# Patient Record
Sex: Female | Born: 1950 | Race: White | Hispanic: No | Marital: Married | Smoking: Never smoker
Health system: Southern US, Community
[De-identification: ages and names within clinical notes are randomized; demographics above are authoritative.]

## PROBLEM LIST (undated history)

## (undated) DIAGNOSIS — E7800 Pure hypercholesterolemia, unspecified: Secondary | ICD-10-CM

## (undated) DIAGNOSIS — B009 Herpesviral infection, unspecified: Secondary | ICD-10-CM

## (undated) DIAGNOSIS — M8580 Other specified disorders of bone density and structure, unspecified site: Secondary | ICD-10-CM

## (undated) DIAGNOSIS — R87619 Unspecified abnormal cytological findings in specimens from cervix uteri: Secondary | ICD-10-CM

## (undated) HISTORY — PX: CERVICAL BIOPSY  W/ LOOP ELECTRODE EXCISION: SUR135

## (undated) HISTORY — DX: Other specified disorders of bone density and structure, unspecified site: M85.80

## (undated) HISTORY — DX: Herpesviral infection, unspecified: B00.9

## (undated) HISTORY — DX: Pure hypercholesterolemia, unspecified: E78.00

## (undated) HISTORY — DX: Unspecified abnormal cytological findings in specimens from cervix uteri: R87.619

---

## 1997-05-30 HISTORY — PX: ABDOMINAL HYSTERECTOMY: SHX81

## 1997-10-28 ENCOUNTER — Ambulatory Visit (HOSPITAL_COMMUNITY): Admission: RE | Admit: 1997-10-28 | Discharge: 1997-10-28 | Payer: Self-pay | Admitting: Gastroenterology

## 1997-10-30 ENCOUNTER — Other Ambulatory Visit: Admission: RE | Admit: 1997-10-30 | Discharge: 1997-10-30 | Payer: Self-pay | Admitting: Gynecology

## 1997-12-15 ENCOUNTER — Inpatient Hospital Stay (HOSPITAL_COMMUNITY): Admission: RE | Admit: 1997-12-15 | Discharge: 1997-12-17 | Payer: Self-pay | Admitting: Gynecology

## 1999-11-15 ENCOUNTER — Encounter: Admission: RE | Admit: 1999-11-15 | Discharge: 1999-11-15 | Payer: Self-pay | Admitting: Gynecology

## 1999-11-15 ENCOUNTER — Encounter: Payer: Self-pay | Admitting: Gynecology

## 2000-11-28 ENCOUNTER — Encounter: Payer: Self-pay | Admitting: Gynecology

## 2000-11-28 ENCOUNTER — Encounter: Admission: RE | Admit: 2000-11-28 | Discharge: 2000-11-28 | Payer: Self-pay | Admitting: Gynecology

## 2000-12-19 ENCOUNTER — Other Ambulatory Visit: Admission: RE | Admit: 2000-12-19 | Discharge: 2000-12-19 | Payer: Self-pay | Admitting: Gynecology

## 2001-04-23 ENCOUNTER — Other Ambulatory Visit: Admission: RE | Admit: 2001-04-23 | Discharge: 2001-04-23 | Payer: Self-pay | Admitting: Gynecology

## 2001-07-24 ENCOUNTER — Other Ambulatory Visit: Admission: RE | Admit: 2001-07-24 | Discharge: 2001-07-24 | Payer: Self-pay | Admitting: Gynecology

## 2001-12-05 ENCOUNTER — Encounter: Payer: Self-pay | Admitting: Gynecology

## 2001-12-05 ENCOUNTER — Encounter: Admission: RE | Admit: 2001-12-05 | Discharge: 2001-12-05 | Payer: Self-pay | Admitting: Gynecology

## 2002-02-05 ENCOUNTER — Other Ambulatory Visit: Admission: RE | Admit: 2002-02-05 | Discharge: 2002-02-05 | Payer: Self-pay | Admitting: Gynecology

## 2002-09-13 ENCOUNTER — Other Ambulatory Visit: Admission: RE | Admit: 2002-09-13 | Discharge: 2002-09-13 | Payer: Self-pay | Admitting: Gynecology

## 2002-12-11 ENCOUNTER — Encounter: Admission: RE | Admit: 2002-12-11 | Discharge: 2002-12-11 | Payer: Self-pay | Admitting: Gynecology

## 2002-12-11 ENCOUNTER — Encounter: Payer: Self-pay | Admitting: Gynecology

## 2003-10-01 ENCOUNTER — Other Ambulatory Visit: Admission: RE | Admit: 2003-10-01 | Discharge: 2003-10-01 | Payer: Self-pay | Admitting: Gynecology

## 2004-01-12 ENCOUNTER — Encounter: Admission: RE | Admit: 2004-01-12 | Discharge: 2004-01-12 | Payer: Self-pay | Admitting: Gynecology

## 2005-03-03 ENCOUNTER — Other Ambulatory Visit: Admission: RE | Admit: 2005-03-03 | Discharge: 2005-03-03 | Payer: Self-pay | Admitting: Gynecology

## 2005-03-21 ENCOUNTER — Encounter: Admission: RE | Admit: 2005-03-21 | Discharge: 2005-03-21 | Payer: Self-pay | Admitting: Gynecology

## 2006-03-07 ENCOUNTER — Other Ambulatory Visit: Admission: RE | Admit: 2006-03-07 | Discharge: 2006-03-07 | Payer: Self-pay | Admitting: Gynecology

## 2006-03-29 ENCOUNTER — Encounter: Admission: RE | Admit: 2006-03-29 | Discharge: 2006-03-29 | Payer: Self-pay | Admitting: Gynecology

## 2007-04-02 ENCOUNTER — Encounter: Admission: RE | Admit: 2007-04-02 | Discharge: 2007-04-02 | Payer: Self-pay | Admitting: Gynecology

## 2008-05-05 ENCOUNTER — Encounter: Admission: RE | Admit: 2008-05-05 | Discharge: 2008-05-05 | Payer: Self-pay | Admitting: Gynecology

## 2009-01-03 ENCOUNTER — Emergency Department (HOSPITAL_BASED_OUTPATIENT_CLINIC_OR_DEPARTMENT_OTHER): Admission: EM | Admit: 2009-01-03 | Discharge: 2009-01-03 | Payer: Self-pay | Admitting: Emergency Medicine

## 2009-01-09 ENCOUNTER — Emergency Department (HOSPITAL_BASED_OUTPATIENT_CLINIC_OR_DEPARTMENT_OTHER): Admission: EM | Admit: 2009-01-09 | Discharge: 2009-01-09 | Payer: Self-pay | Admitting: Emergency Medicine

## 2009-05-06 ENCOUNTER — Encounter: Admission: RE | Admit: 2009-05-06 | Discharge: 2009-05-06 | Payer: Self-pay | Admitting: Gynecology

## 2010-05-13 ENCOUNTER — Encounter
Admission: RE | Admit: 2010-05-13 | Discharge: 2010-05-13 | Payer: Self-pay | Source: Home / Self Care | Attending: Gynecology | Admitting: Gynecology

## 2011-04-07 ENCOUNTER — Other Ambulatory Visit: Payer: Self-pay | Admitting: Gynecology

## 2011-04-07 DIAGNOSIS — Z1231 Encounter for screening mammogram for malignant neoplasm of breast: Secondary | ICD-10-CM

## 2011-05-16 ENCOUNTER — Ambulatory Visit
Admission: RE | Admit: 2011-05-16 | Discharge: 2011-05-16 | Disposition: A | Discharge status: Home / Self Care | Payer: BC Managed Care – PPO | Source: Ambulatory Visit | Attending: Gynecology | Admitting: Gynecology

## 2011-05-16 DIAGNOSIS — Z1231 Encounter for screening mammogram for malignant neoplasm of breast: Secondary | ICD-10-CM

## 2012-04-17 ENCOUNTER — Other Ambulatory Visit: Payer: Self-pay | Admitting: Gynecology

## 2012-04-17 DIAGNOSIS — Z1231 Encounter for screening mammogram for malignant neoplasm of breast: Secondary | ICD-10-CM

## 2012-06-04 ENCOUNTER — Ambulatory Visit
Admission: RE | Admit: 2012-06-04 | Discharge: 2012-06-04 | Disposition: A | Discharge status: Home / Self Care | Payer: BC Managed Care – PPO | Source: Ambulatory Visit | Attending: Gynecology | Admitting: Gynecology

## 2012-06-04 DIAGNOSIS — Z1231 Encounter for screening mammogram for malignant neoplasm of breast: Secondary | ICD-10-CM

## 2012-12-10 ENCOUNTER — Ambulatory Visit (INDEPENDENT_AMBULATORY_CARE_PROVIDER_SITE_OTHER): Payer: BC Managed Care – PPO | Admitting: Gynecology

## 2012-12-10 ENCOUNTER — Other Ambulatory Visit (HOSPITAL_COMMUNITY)
Admission: RE | Admit: 2012-12-10 | Discharge: 2012-12-10 | Disposition: A | Discharge status: Home / Self Care | Payer: BC Managed Care – PPO | Source: Ambulatory Visit | Attending: Gynecology | Admitting: Gynecology

## 2012-12-10 ENCOUNTER — Encounter: Payer: Self-pay | Admitting: Gynecology

## 2012-12-10 VITALS — BP 128/80 | Ht 64.75 in | Wt 144.0 lb

## 2012-12-10 DIAGNOSIS — M899 Disorder of bone, unspecified: Secondary | ICD-10-CM

## 2012-12-10 DIAGNOSIS — Z79890 Hormone replacement therapy (postmenopausal): Secondary | ICD-10-CM

## 2012-12-10 DIAGNOSIS — N952 Postmenopausal atrophic vaginitis: Secondary | ICD-10-CM

## 2012-12-10 DIAGNOSIS — Z01419 Encounter for gynecological examination (general) (routine) without abnormal findings: Secondary | ICD-10-CM

## 2012-12-10 DIAGNOSIS — R635 Abnormal weight gain: Secondary | ICD-10-CM

## 2012-12-10 DIAGNOSIS — Z1159 Encounter for screening for other viral diseases: Secondary | ICD-10-CM

## 2012-12-10 DIAGNOSIS — Z1151 Encounter for screening for human papillomavirus (HPV): Secondary | ICD-10-CM | POA: Insufficient documentation

## 2012-12-10 DIAGNOSIS — M8580 Other specified disorders of bone density and structure, unspecified site: Secondary | ICD-10-CM

## 2012-12-10 DIAGNOSIS — M949 Disorder of cartilage, unspecified: Secondary | ICD-10-CM

## 2012-12-10 DIAGNOSIS — E7800 Pure hypercholesterolemia, unspecified: Secondary | ICD-10-CM

## 2012-12-10 DIAGNOSIS — E785 Hyperlipidemia, unspecified: Secondary | ICD-10-CM | POA: Insufficient documentation

## 2012-12-10 NOTE — Addendum Note (Signed)
Addended by: Ok Edwards on: 12/10/2012 03:44 PM   Modules accepted: Orders

## 2012-12-10 NOTE — Progress Notes (Signed)
Karla Lee 1951/02/02 [id number]   History:    62 y.o.  for annual gyn exam who is a new patient to the practice. Patient for many years was being followed by Dr. Nicholas Lose. Review of patient's record that she brought copies of indicated that she is a gravida 4, para 2, AB 2. Patient with past history of supracervical hysterectomy for abnormal uterine bleeding unresponsive to conservative therapy. 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 Patient with history of hypercholesterolemia on simvastatin 10 mg daily. Patient's last bone density study in 2012 described as having osteopenia. Patient is on calcium and vitamin D. Patient exercises regularly. Patient with normal colonoscopy in 2009. Last mammogram January 2014 normal. Patient has done well since she has been using Estrace vaginal cream twice a week for vaginal atrophy. She is not receive the shingles or the Tdap vaccine. 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 Patient denies any recent outbreak.patient is being followed by an allergist for her atopic dermatitis.patient was concerned about the possibility of a small lesion noted on her left labia majora  Past medical history,surgical history, family history and social history were all reviewed and documented in the EPIC chart.  Gynecologic History No LMP recorded. Patient has had a hysterectomy. Contraception: post menopausal status Last Pap: 2012. Results were: normal Last mammogram: 2014. Results were: normal  Obstetric History OB History   Grav Para Term Preterm Abortions TAB SAB Ect Mult Living   4 2   2  2   2      # Outc Date GA Lbr Len/2nd Wgt Sex Del Anes PTL Lv   1 PAR            2 PAR            3 SAB            4 SAB                ROS: A ROS was performed and pertinent positives and negatives are included in the history.  GENERAL: No fevers or chills. HEENT: No change in  vision, no earache, sore throat or sinus congestion. NECK: No pain or stiffness. CARDIOVASCULAR: No chest pain or pressure. No palpitations. PULMONARY: No shortness of breath, cough or wheeze. GASTROINTESTINAL: No abdominal pain, nausea, vomiting or diarrhea, melena or bright red blood per rectum. GENITOURINARY: patient complaining of a small nodular-like area along the lateral aspect of the left labia majoraMUSCULOSKELETAL: No joint or muscle pain, no back pain, no recent trauma. DERMATOLOGIC: No rash, no itching, no lesions. ENDOCRINE: No polyuria, polydipsia, no heat or cold intolerance. No recent change in weight. HEMATOLOGICAL: No anemia or easy bruising or bleeding. NEUROLOGIC: No headache, seizures, numbness, tingling or weakness. PSYCHIATRIC: No depression, no loss of interest in normal activity or change in sleep pattern.     Exam: chaperone present  BP 128/80  Ht 5' 4.75" (1.645 m)  Wt 144 lb (65.318 kg)  BMI 24.14 kg/m2  Body mass index is 24.14 kg/(m^2).  General appearance : Well developed well nourished female. No acute distress HEENT: Neck supple, trachea midline, no carotid bruits, no thyroidmegaly Lungs: Clear to auscultation, no rhonchi or wheezes, or rib retractions  Heart: Regular rate and rhythm, no murmurs or gallops Breast:Examined in sitting and supine position were symmetrical in appearance, no palpable masses or tenderness,  no skin retraction, no nipple inversion, no nipple discharge,  no skin discoloration, no axillary or supraclavicular lymphadenopathy Abdomen: no palpable masses or tenderness, no rebound or guarding Extremities: no edema or skin discoloration or tenderness  Pelvic:  Bartholin, Urethra, Skene Glands: Within normal limits. Small epidermal inclusion cyst lateral aspect of the left labia majora             Vagina: No gross lesions or discharge  Cervix: No gross lesions or discharge  Uterus  anteverted, normal size, shape and consistency, non-tender  and mobile  Adnexa  Without masses or tenderness  Anus and perineum  normal   Rectovaginal  normal sphincter tone without palpated masses or tenderness             Hemoccult card provided     Assessment/Plan:  62 y.o. female for annual exam with small epidermal inclusion cyst lateral aspect of the left labia majora. The patient will do sitz baths will monitor it if it gets bigger or causes any discomfort she will return back to the office for reassessment. The patient will check with her allergist in reference to her being okay to receive the shingles and Tdap vaccine. The following labs were ordered: CBC, TSH, hemoglobin A1c, SGOT, SGPT, screen cholesterol, and hepatitis C and urinalysis as well as Pap smear. The new guidelines were discussed. Hemoccult cards were provided for her to submit to the office for testing. I have given her the name of one of my colleagues near where she lives so that she can be followup with an internist for her hyperlipidemia.  New CDC guidelines is recommending patients be tested once in her lifetime for hepatitis C antibody who were born between [birth year] through 1965. This was discussed with the patient today and has agreed to be tested today.   Ok Edwards MD, 3:15 PM 12/10/2012

## 2012-12-10 NOTE — Patient Instructions (Addendum)
Breast Self-Awareness Practicing breast self-awareness may pick up problems early, prevent significant medical complications, and possibly save your life. By practicing breast self-awareness, you can become familiar with how your breasts look and feel and if your breasts are changing. This allows you to notice changes early. It can also offer you some reassurance that your breast health is good. One way to learn what is normal for your breasts and whether your breasts are changing is to do a breast self-exam. If you find a lump or something that was not present in the past, it is best to contact your caregiver right away. Other findings that should be evaluated by your caregiver include nipple discharge, especially if it is bloody; skin changes or reddening; areas where the skin seems to be pulled in (retracted); or new lumps and bumps. Breast pain is seldom associated with cancer (malignancy), but should also be evaluated by a caregiver. HOW TO PERFORM A BREAST SELF-EXAM The best time to examine your breasts is 5 7 days after your menstrual period is over. During menstruation, the breasts are lumpier, and it may be more difficult to pick up changes. If you do not menstruate, have reached menopause, or had your uterus removed (hysterectomy), you should examine your breasts at regular intervals, such as monthly. If you are breastfeeding, examine your breasts after a feeding or after using a breast pump. Breast implants do not decrease the risk for lumps or tumors, so continue to perform breast self-exams as recommended. Talk to your caregiver about how to determine the difference between the implant and breast tissue. Also, talk about the amount of pressure you should use during the exam. Over time, you will become more familiar with the variations of your breasts and more comfortable with the exam. A breast self-exam requires you to remove all your clothes above the waist. 1. Look at your breasts and nipples.  Stand in front of a mirror in a room with good lighting. With your hands on your hips, push your hands firmly downward. Look for a difference in shape, contour, and size from one breast to the other (asymmetry). Asymmetry includes puckers, dips, or bumps. Also, look for skin changes, such as reddened or scaly areas on the breasts. Look for nipple changes, such as discharge, dimpling, repositioning, or redness. 2. Carefully feel your breasts. This is best done either in the shower or tub while using soapy water or when flat on your back. Place the arm (on the side of the breast you are examining) above your head. Use the pads (not the fingertips) of your three middle fingers on your opposite hand to feel your breasts. Start in the underarm area and use  inch (2 cm) overlapping circles to feel your breast. Use 3 different levels of pressure (light, medium, and firm pressure) at each circle before moving to the next circle. The light pressure is needed to feel the tissue closest to the skin. The medium pressure will help to feel breast tissue a little deeper, while the firm pressure is needed to feel the tissue close to the ribs. Continue the overlapping circles, moving downward over the breast until you feel your ribs below your breast. Then, move one finger-width towards the center of the body. Continue to use the  inch (2 cm) overlapping circles to feel your breast as you move slowly up toward the collar bone (clavicle) near the base of the neck. Continue the up and down exam using all 3 pressures until you reach   the middle of the chest. Do this with each breast, carefully feeling for lumps or changes. 3.  Keep a written record with breast changes or normal findings for each breast. By writing this information down, you do not need to depend only on memory for size, tenderness, or location. Write down where you are in your menstrual cycle, if you are still menstruating. Breast tissue can have some lumps or  thick tissue. However, see your caregiver if you find anything that concerns you.  SEEK MEDICAL CARE IF:  You see a change in shape, contour, or size of your breasts or nipples.   You see skin changes, such as reddened or scaly areas on the breasts or nipples.   You have an unusual discharge from your nipples.   You feel a new lump or unusually thick areas.  Document Released: 05/16/2005 Document Revised: 05/02/2012 Document Reviewed: 08/31/2011 [facility] Patient Information 2014 [place], [place].  Tetanus, Diphtheria, Pertussis (Tdap) Vaccine What You Need to Know WHY GET VACCINATED? Tetanus, diphtheria and pertussis can be very serious diseases, even for adolescents and adults. Tdap vaccine can protect [place] from these diseases. TETANUS (Lockjaw) causes painful muscle tightening and stiffness, usually all over the body.  It can lead to tightening of muscles in the head and neck so you can't open your mouth, swallow, or sometimes even breathe. Tetanus kills about 1 out of 5 people who are infected. DIPHTHERIA can cause a thick coating to form in the back of the throat.  It can lead to breathing problems, paralysis, heart failure, and death. PERTUSSIS (Whooping Cough) causes severe coughing spells, which can cause difficulty breathing, vomiting and disturbed sleep.  It can also lead to weight loss, incontinence, and rib fractures. Up to 2 in 100 adolescents and 5 in 100 adults with pertussis are hospitalized or have complications, which could include pneumonia and death. These diseases are caused by bacteria. Diphtheria and pertussis are spread from person to person through coughing or sneezing. Tetanus enters the body through cuts, scratches, or wounds. Before vaccines, the [place] saw as many as 200,000 cases a year of diphtheria and pertussis, and hundreds of cases of tetanus. Since vaccination began, tetanus and diphtheria have dropped by about 99% and pertussis by about  80%. TDAP VACCINE Tdap vaccine can protect adolescents and adults from tetanus, diphtheria, and pertussis. One dose of Tdap is routinely given at age 74 or 87. People who did not get Tdap at that age should get it as soon as possible. Tdap is especially important for health care professionals and anyone having close contact with a baby younger than 12 months. Pregnant women should get a dose of Tdap during every pregnancy, to protect the newborn from pertussis. Infants are most at risk for severe, life-threatening complications from pertussis. A similar vaccine, called Td, protects from tetanus and diphtheria, but not pertussis. A Td booster should be given every 10 years. Tdap may be given as one of these boosters if you have not already gotten a dose. Tdap may also be given after a severe cut or burn to prevent tetanus infection. Your doctor can give you more information. Tdap may safely be given at the same time as other vaccines. SOME PEOPLE SHOULD NOT GET THIS VACCINE  If you ever had a life-threatening allergic reaction after a dose of any tetanus, diphtheria, or pertussis containing vaccine, OR if you have a severe allergy to any part of this vaccine, you should not get Tdap. Tell your doctor  if you have any severe allergies.  If you had a coma, or long or multiple seizures within 7 days after a childhood dose of DTP or DTaP, you should not get Tdap, unless a cause other than the vaccine was found. You can still get Td.  Talk to your doctor if you:  have epilepsy or another nervous system problem,  had severe pain or swelling after any vaccine containing diphtheria, tetanus or pertussis,  ever had Guillain-Barr Syndrome (GBS),  aren't feeling well on the day the shot is scheduled. RISKS OF A VACCINE REACTION With any medicine, including vaccines, there is a chance of side effects. These are usually mild and go away on their own, but serious reactions are also possible. Brief fainting  spells can follow a vaccination, leading to injuries from falling. Sitting or lying down for about 15 minutes can help prevent these. Tell your doctor if you feel dizzy or light-headed, or have vision changes or ringing in the ears. Mild problems following Tdap (Did not interfere with activities)  Pain where the shot was given (about 3 in 4 adolescents or 2 in 3 adults)  Redness or swelling where the shot was given (about 1 person in 5)  Mild fever of at least 100.45F (up to about 1 in 25 adolescents or 1 in 100 adults)  Headache (about 3 or 4 people in 10)  Tiredness (about 1 person in 3 or 4)  Nausea, vomiting, diarrhea, stomach ache (up to 1 in 4 adolescents or 1 in 10 adults)  Chills, body aches, sore joints, rash, swollen glands (uncommon) Moderate problems following Tdap (Interfered with activities, but did not require medical attention)  Pain where the shot was given (about 1 in 5 adolescents or 1 in 100 adults)  Redness or swelling where the shot was given (up to about 1 in 16 adolescents or 1 in 25 adults)  Fever over 102F (about 1 in 100 adolescents or 1 in 250 adults)  Headache (about 3 in 20 adolescents or 1 in 10 adults)  Nausea, vomiting, diarrhea, stomach ache (up to 1 or 3 people in 100)  Swelling of the entire arm where the shot was given (up to about 3 in 100). Severe problems following Tdap (Unable to perform usual activities, required medical attention)  Swelling, severe pain, bleeding and redness in the arm where the shot was given (rare). A severe allergic reaction could occur after any vaccine (estimated less than 1 in a million doses). WHAT IF THERE IS A SERIOUS REACTION? What should I look for?  Look for anything that concerns you, such as signs of a severe allergic reaction, very high fever, or behavior changes. Signs of a severe allergic reaction can include hives, swelling of the face and throat, difficulty breathing, a fast heartbeat, dizziness, and  weakness. These would start a few minutes to a few hours after the vaccination. What should I do?  If you think it is a severe allergic reaction or other emergency that can't wait, call 9-1-1 or get the person to the nearest hospital. Otherwise, call your doctor.  Afterward, the reaction should be reported to the "Vaccine Adverse Event Reporting System" (VAERS). Your doctor might file this report, or you can do it yourself through the VAERS web site at www.vaers.LAgents.no, or by calling 1-7341862463. VAERS is only for reporting reactions. They do not give medical advice.  THE NATIONAL VACCINE INJURY COMPENSATION PROGRAM The National Vaccine Injury Compensation Program (VICP) is a federal program that was  created to compensate people who may have been injured by certain vaccines. Persons who believe they may have been injured by a vaccine can learn about the program and about filing a claim by calling 1-(915)251-3612 or visiting the VICP website at SpiritualWord.at. HOW CAN I LEARN MORE?  Ask your doctor.  Call your local or state health department.  Contact the Centers for Disease Control and Prevention (CDC):  Call 616-016-8760 or visit CDC's website at PicCapture.uy. CDC Tdap Vaccine VIS (10/06/11) Document Released: 11/15/2011 Document Revised: 02/08/2012 Document Reviewed: 11/15/2011 [employer] Patient Information 2014 [place], [place]. Shingles Vaccine What You Need to Know WHAT IS SHINGLES?  Shingles is a painful skin rash, often with blisters. It is also called Herpes Zoster or just Zoster.  A shingles rash usually appears on one side of the face or body and lasts from 2 to 4 weeks. Its main symptom is pain, which can be quite severe. Other symptoms of shingles can include fever, headache, chills, and upset stomach. Very rarely, a shingles infection can lead to pneumonia, hearing problems, blindness, brain inflammation (encephalitis), or death.  For about 1  person in 5, severe pain can continue even after the rash clears up. This is called post-herpetic neuralgia.  Shingles is caused by the Varicella Zoster virus. This is the same virus that causes chickenpox. Only someone who has had a case of chickenpox or rarely, has gotten chickenpox vaccine, can get shingles. The virus stays in your body. It can reappear many years later to cause a case of shingles.  You cannot catch shingles from another person with shingles. However, a person who has never had chickenpox (or chickenpox vaccine) could get chickenpox from someone with shingles. This is not very common.  Shingles is far more common in people 44 and older than in younger people. It is also more common in people whose immune systems are weakened because of a disease such as cancer or drugs such as steroids or chemotherapy.  At least 1 million people get shingles per year in the [place]. SHINGLES VACCINE  A vaccine for shingles was licensed in 2006. In clinical trials, the vaccine reduced the risk of shingles by 50%. It can also reduce the pain in people who still get shingles after being vaccinated.  A single dose of shingles vaccine is recommended for adults 27 years of age and older. SOME PEOPLE SHOULD NOT GET SHINGLES VACCINE OR SHOULD WAIT A person should not get shingles vaccine if he or she:  Has ever had a life-threatening allergic reaction to gelatin, the antibiotic neomycin, or any other component of shingles vaccine. Tell your caregiver if you have any severe allergies.  Has a weakened immune system because of current:  AIDS or another disease that affects the immune system.  Treatment with drugs that affect the immune system, such as prolonged use of high-dose steroids.  Cancer treatment, such as radiation or chemotherapy.  Cancer affecting the bone marrow or lymphatic system, such as leukemia or lymphoma.  Is pregnant, or might be pregnant. Women should not become  pregnant until at least 4 weeks after getting shingles vaccine. Someone with a minor illness, such as a cold, may be vaccinated. Anyone with a moderate or severe acute illness should usually wait until he or she recovers before getting the vaccine. This includes anyone with a temperature of 101.3 F (38 C) or higher. WHAT ARE THE RISKS FROM SHINGLES VACCINE?  A vaccine, like any medicine, could possibly cause serious problems, such  as severe allergic reactions. However, the risk of a vaccine causing serious harm, or death, is extremely small.  No serious problems have been identified with shingles vaccine. Mild Problems  Redness, soreness, swelling, or itching at the site of the injection (about 1 person in 3).  Headache (about 1 person in 70). Like all vaccines, shingles vaccine is being closely monitored for unusual or severe problems. WHAT IF THERE IS A MODERATE OR SEVERE REACTION? What should I look for? Any unusual condition, such as a severe allergic reaction or a high fever. If a severe allergic reaction occurred, it would be within a few minutes to an hour after the shot. Signs of a serious allergic reaction can include difficulty breathing, weakness, hoarseness or wheezing, a fast heartbeat, hives, dizziness, paleness, or swelling of the throat. What should I do?  Call your caregiver, or get the person to a caregiver right away.  Tell the caregiver what happened, the date and time it happened, and when the vaccination was given.  Ask the caregiver to report the reaction by filing a Vaccine Adverse Event Reporting System (VAERS) form. Or, you can file this report through the VAERS web site at www.vaers.LAgents.no or by calling 1-216 315 4696. VAERS does not provide medical advice. HOW CAN I LEARN MORE?  Ask your caregiver. He or she can give you the vaccine package insert or suggest other sources of information.  Contact the Centers for Disease Control and Prevention (CDC):  Call  2604392013 (1-800-CDC-INFO).  Visit the CDC website at PicCapture.uy CDC Shingles Vaccine VIS (03/04/08) Document Released: 03/13/2006 Document Revised: 08/08/2011 Document Reviewed: 03/04/2008 [employer] Patient Information 2014 [place], [place]. Bone Densitometry Bone densitometry is a special X-ray that measures your bone density and can be used to help predict your risk of bone fractures. This test is used to determine bone mineral content and density to diagnose osteoporosis. Osteoporosis is the loss of bone that may cause the bone to become weak. Osteoporosis commonly occurs in women entering menopause. However, it may be found in men and in people with other diseases. PREPARATION FOR TEST No preparation necessary. WHO SHOULD BE TESTED?  All women older than 91.  Postmenopausal women (50 to 3) with risk factors for osteoporosis.  People with a previous fracture caused by normal activities.  People with a small body frame (less than 127 poundsor a body mass index [BMI] of less than 21).  People who have a parent with a hip fracture or history of osteoporosis.  People who smoke.  People who have rheumatoid arthritis.  Anyone who engages in excessive alcohol use (more than 3 drinks most days).  Women who experience early menopause. WHEN SHOULD YOU BE RETESTED? Current guidelines suggest that you should wait at least 2 years before doing a bone density test again if your first test was normal.Recent studies indicated that women with normal bone density may be able to wait a few years before needing to repeat a bone density test. You should discuss this with your caregiver.  NORMAL FINDINGS   Normal: less than standard deviation below normal (greater than -1).  Osteopenia: 1 to 2.5 standard deviations below normal (-1 to -2.5).  Osteoporosis: greater than 2.5 standard deviations below normal (less than -2.5). Test results are reported as a "T score" and a "Z  score."The T score is a number that compares your bone density with the bone density of healthy, young women.The Z score is a number that compares your bone density with the scores of  women who are the same age, gender, and race.  Ranges for normal findings may vary among different laboratories and hospitals. You should always check with your doctor after having lab work or other tests done to discuss the meaning of your test results and whether your values are considered within normal limits. MEANING OF TEST  Your caregiver will go over the test results with you and discuss the importance and meaning of your results, as well as treatment options and the need for additional tests if necessary. OBTAINING THE TEST RESULTS It is your responsibility to obtain your test results. Ask the lab or department performing the test when and how you will get your results. Document Released: 06/07/2004 Document Revised: 08/08/2011 Document Reviewed: 06/30/2010 [facility] Patient Information 2014 [place], [place].  Epidermal Cyst An epidermal cyst is sometimes called a sebaceous cyst, epidermal inclusion cyst, or infundibular cyst. These cysts usually contain a substance that looks "pasty" or "cheesy" and may have a bad smell. This substance is a protein called keratin. Epidermal cysts are usually found on the face, neck, or trunk. They may also occur in the vaginal area or other parts of the genitalia of both men and women. Epidermal cysts are usually small, painless, slow-growing bumps or lumps that move freely under the skin. It is important not to try to pop them. This may cause an infection and lead to tenderness and swelling. CAUSES  Epidermal cysts may be caused by a deep penetrating injury to the skin or a plugged hair follicle, often associated with acne. SYMPTOMS  Epidermal cysts can become inflamed and cause:  Redness.  Tenderness.  Increased temperature of the skin over the bumps or  lumps.  Grayish-white, bad smelling material that drains from the bump or lump. DIAGNOSIS  Epidermal cysts are easily diagnosed by your caregiver during an exam. Rarely, a tissue sample (biopsy) may be taken to rule out other conditions that may resemble epidermal cysts. TREATMENT   Epidermal cysts often get better and disappear on their own. They are rarely ever cancerous.  If a cyst becomes infected, it may become inflamed and tender. This may require opening and draining the cyst. Treatment with antibiotics may be necessary. When the infection is gone, the cyst may be removed with minor surgery.  Small, inflamed cysts can often be treated with antibiotics or by injecting steroid medicines.  Sometimes, epidermal cysts become large and bothersome. If this happens, surgical removal in your caregiver's office may be necessary. HOME CARE INSTRUCTIONS  Only take over-the-counter or prescription medicines as directed by your caregiver.  Take your antibiotics as directed. Finish them even if you start to feel better. SEEK MEDICAL CARE IF:   Your cyst becomes tender, red, or swollen.  Your condition is not improving or is getting worse.  You have any other questions or concerns. MAKE SURE YOU:  Understand these instructions.  Will watch your condition.  Will get help right away if you are not doing well or get worse. Document Released: 04/16/2004 Document Revised: 08/08/2011 Document Reviewed: 11/22/2010 [facility] Patient Information 2014 [place], [place].

## 2012-12-11 LAB — URINALYSIS W MICROSCOPIC + REFLEX CULTURE
Bilirubin Urine: NEGATIVE
Crystals: NONE SEEN
Glucose, UA: NEGATIVE mg/dL
Leukocytes, UA: NEGATIVE
Nitrite: NEGATIVE
Protein, ur: NEGATIVE mg/dL
Specific Gravity, Urine: 1.006 (ref 1.005–1.030)
pH: 6 (ref 5.0–8.0)

## 2012-12-12 LAB — CBC WITH DIFFERENTIAL/PLATELET
Basophils Absolute: 0 10*3/uL (ref 0.0–0.1)
Eosinophils Absolute: 0.1 10*3/uL (ref 0.0–0.7)
HCT: 41.3 % (ref 36.0–46.0)
Hemoglobin: 14.5 g/dL (ref 12.0–15.0)
MCH: 34 pg (ref 26.0–34.0)
MCHC: 35.1 g/dL (ref 30.0–36.0)
MCV: 96.9 fL (ref 78.0–100.0)
Neutro Abs: 3.6 10*3/uL (ref 1.7–7.7)
RBC: 4.26 MIL/uL (ref 3.87–5.11)
RDW: 13.5 % (ref 11.5–15.5)
WBC: 5.3 10*3/uL (ref 4.0–10.5)

## 2012-12-12 LAB — HEPATITIS C ANTIBODY: HCV Ab: NEGATIVE

## 2012-12-12 LAB — HEMOGLOBIN A1C: Mean Plasma Glucose: 111 mg/dL (ref <117)

## 2012-12-13 ENCOUNTER — Telehealth: Payer: Self-pay | Admitting: *Deleted

## 2012-12-13 ENCOUNTER — Other Ambulatory Visit: Payer: Self-pay | Admitting: Gynecology

## 2012-12-13 LAB — TSH: TSH: 1.898 u[IU]/mL (ref 0.350–4.500)

## 2012-12-13 MED ORDER — NITROFURANTOIN MONOHYD MACRO 100 MG PO CAPS: 100.0000 mg | ORAL_CAPSULE | Freq: Two times a day (BID) | ORAL | Status: DC

## 2012-12-13 MED ORDER — ESTRADIOL 0.1 MG/GM VA CREA: TOPICAL_CREAM | VAGINAL | Status: DC

## 2012-12-13 NOTE — Telephone Encounter (Signed)
Pt was here for annual on 12/10/12 never received her Rx from pharmacy for estrace 0.1 vaginal cream 1 gram twice weekly. Okay to send to pharmacy? Please advise

## 2012-12-13 NOTE — Telephone Encounter (Signed)
Yes please: Prescription thank you

## 2012-12-13 NOTE — Telephone Encounter (Signed)
Rx sent with refills.

## 2012-12-14 ENCOUNTER — Encounter: Payer: Self-pay | Admitting: Gynecology

## 2012-12-14 LAB — URINE CULTURE: Colony Count: 10000

## 2012-12-14 NOTE — Telephone Encounter (Signed)
Patient saw Dr. Glenetta Hew  12/10/12.

## 2012-12-17 ENCOUNTER — Telehealth: Payer: Self-pay | Admitting: *Deleted

## 2012-12-17 ENCOUNTER — Other Ambulatory Visit: Payer: Self-pay | Admitting: Gynecology

## 2012-12-17 MED ORDER — LEVOFLOXACIN 250 MG PO TABS: 250.0000 mg | ORAL_TABLET | Freq: Every day | ORAL | Status: DC

## 2012-12-17 MED ORDER — NONFORMULARY OR COMPOUNDED ITEM: Status: DC

## 2012-12-17 NOTE — Telephone Encounter (Signed)
She can take Levaquin 250mg  one po for 3 days #3

## 2012-12-17 NOTE — Telephone Encounter (Signed)
Pt informed with the below note, rx sent. 

## 2012-12-17 NOTE — Telephone Encounter (Signed)
Pt received Rx for Macrobid 100 mg x 7 days on 12/13/12 pt is c/o aching and lots of pressure while taking Macrobid. She has used Levaquin in past and has worked well. Please advise

## 2013-01-22 ENCOUNTER — Ambulatory Visit (INDEPENDENT_AMBULATORY_CARE_PROVIDER_SITE_OTHER): Payer: BC Managed Care – PPO

## 2013-01-22 DIAGNOSIS — Z79890 Hormone replacement therapy (postmenopausal): Secondary | ICD-10-CM

## 2013-01-22 DIAGNOSIS — M899 Disorder of bone, unspecified: Secondary | ICD-10-CM

## 2013-01-22 DIAGNOSIS — M8580 Other specified disorders of bone density and structure, unspecified site: Secondary | ICD-10-CM

## 2013-02-04 ENCOUNTER — Encounter: Payer: Self-pay | Admitting: Gynecology

## 2013-02-04 ENCOUNTER — Other Ambulatory Visit: Payer: Self-pay | Admitting: *Deleted

## 2013-02-04 MED ORDER — SIMVASTATIN 10 MG PO TABS: 10.0000 mg | ORAL_TABLET | Freq: Every day | ORAL | Status: DC

## 2013-04-04 ENCOUNTER — Other Ambulatory Visit: Payer: Self-pay

## 2013-06-10 ENCOUNTER — Other Ambulatory Visit: Payer: Self-pay | Admitting: Gynecology

## 2013-06-10 ENCOUNTER — Telehealth: Payer: Self-pay

## 2013-06-10 ENCOUNTER — Encounter: Payer: Self-pay | Admitting: Gynecology

## 2013-06-10 DIAGNOSIS — E7800 Pure hypercholesterolemia, unspecified: Secondary | ICD-10-CM

## 2013-06-10 MED ORDER — SIMVASTATIN 10 MG PO TABS: 10.0000 mg | ORAL_TABLET | Freq: Every day | ORAL | Status: DC

## 2013-06-10 NOTE — Telephone Encounter (Signed)
Dr. Glenetta HewJF- Patient called back in September saying it was time for refill on her Simvastatin.  Evidently this was prescribed for her by Dr. Nicholas LoseLomax. She was a NG with you in July.    Today she contacts us stating a 90 day supply is required by insurance.  I am just checking with you to be sure okay that I call that in for her since I did not see it documented where you gave it to her at visit. You did note "Patient with history of hypercholesterolemia on simvastatin 10 mg daily."  Please advise.

## 2013-06-10 NOTE — Telephone Encounter (Signed)
Patient had emailed through My Chart regarding getting her Simvastin refilled. Dr. Glenetta HewJF recommended via staff message to me "we need to get a fasting lipid profile along with SGOT/SGPT this week."  I emailed patient back and let her know that I sent her refill in but that she would need to check labs this week and orders were in.  I advised her to come fasting after dinner with nothing but water and to call for a lab appointment.  Orders put in system.

## 2013-06-11 NOTE — Telephone Encounter (Signed)
Dr. JF-Patient said she is happy to comply with your request for having her FLP and LFT's checked this week but she did just have this done in July 2014  and it was all normal. She said she had been doing this yearly previouly and just wanted to make sure you knew she had it checked in July.

## 2013-06-18 ENCOUNTER — Other Ambulatory Visit: Payer: BC Managed Care – PPO

## 2013-06-18 DIAGNOSIS — E7800 Pure hypercholesterolemia, unspecified: Secondary | ICD-10-CM

## 2013-06-18 LAB — LIPID PANEL
Cholesterol: 177 mg/dL (ref 0–200)
HDL: 68 mg/dL (ref >39)
LDL CALC: 92 mg/dL (ref 0–99)
Total CHOL/HDL Ratio: 2.6 Ratio
Triglycerides: 86 mg/dL (ref <150)
VLDL: 17 mg/dL (ref 0–40)

## 2013-06-18 LAB — ALT: ALT: 13 U/L (ref 0–35)

## 2013-06-18 LAB — AST: AST: 18 U/L (ref 0–37)

## 2013-08-01 ENCOUNTER — Other Ambulatory Visit: Payer: Self-pay

## 2013-08-01 DIAGNOSIS — Z1231 Encounter for screening mammogram for malignant neoplasm of breast: Secondary | ICD-10-CM

## 2013-08-09 ENCOUNTER — Ambulatory Visit
Admission: RE | Admit: 2013-08-09 | Discharge: 2013-08-09 | Disposition: A | Discharge status: Home / Self Care | Payer: BC Managed Care – PPO | Source: Ambulatory Visit

## 2013-08-09 DIAGNOSIS — Z1231 Encounter for screening mammogram for malignant neoplasm of breast: Secondary | ICD-10-CM

## 2013-10-17 ENCOUNTER — Ambulatory Visit (INDEPENDENT_AMBULATORY_CARE_PROVIDER_SITE_OTHER): Payer: BC Managed Care – PPO | Admitting: Women's Health

## 2013-10-17 ENCOUNTER — Encounter: Payer: Self-pay | Admitting: Women's Health

## 2013-10-17 DIAGNOSIS — B9689 Other specified bacterial agents as the cause of diseases classified elsewhere: Secondary | ICD-10-CM

## 2013-10-17 DIAGNOSIS — N760 Acute vaginitis: Secondary | ICD-10-CM

## 2013-10-17 DIAGNOSIS — N898 Other specified noninflammatory disorders of vagina: Secondary | ICD-10-CM

## 2013-10-17 DIAGNOSIS — A499 Bacterial infection, unspecified: Secondary | ICD-10-CM

## 2013-10-17 LAB — WET PREP FOR TRICH, YEAST, CLUE
Trich, Wet Prep: NONE SEEN
YEAST WET PREP: NONE SEEN

## 2013-10-17 MED ORDER — METRONIDAZOLE 0.75 % VA GEL: VAGINAL | Status: DC

## 2013-10-17 NOTE — Patient Instructions (Signed)
Bacterial Vaginosis Bacterial vaginosis is an infection of the vagina. It happens when too many of certain germs (bacteria) grow in the vagina. HOME CARE  Take your medicine as told by your doctor.  Finish your medicine even if you start to feel better.  Do not have sex until you finish your medicine and are better.  Tell your sex partner that you have an infection. They should see their doctor for treatment.  Practice safe sex. Use condoms. Have only one sex partner. GET HELP IF:  You are not getting better after 3 days of treatment.  You have more grey fluid (discharge) coming from your vagina than before.  You have more pain than before.  You have a fever. MAKE SURE YOU:   Understand these instructions.  Will watch your condition.  Will get help right away if you are not doing well or get worse. Document Released: 02/23/2008 Document Revised: 03/06/2013 Document Reviewed: 12/26/2012 ExitCare Patient Information 2014 [employer].  

## 2013-10-17 NOTE — Progress Notes (Signed)
Patient ID: Karla Lee, female   DOB: July 22, 1950, 63 y.o.   MRN: [id number] Presents with complaint of vaginal irritation with burning sensation with some itching for 1 week. Denies abdominal pain, fever, urinary symptoms. TAH for dysfunctional uterine bleeding supracervical/no HRT.  Exam: Appears well. External genitalia slight erythema at introitus, speculum exam moderate amount of a white adherent discharge wet prep positive for amines, clues, and TNTC bacteria. Bimanual nontender no fullness noted.  Bacteria vaginosis  Plan: MetroGel vaginal cream 1 applicator at bedtime x5, alcohol precautions reviewed. Instructed to call if no relief of symptoms.

## 2013-12-11 ENCOUNTER — Other Ambulatory Visit: Payer: Self-pay | Admitting: Dermatology

## 2013-12-16 ENCOUNTER — Ambulatory Visit (INDEPENDENT_AMBULATORY_CARE_PROVIDER_SITE_OTHER): Payer: BC Managed Care – PPO | Admitting: Gynecology

## 2013-12-16 ENCOUNTER — Encounter: Payer: Self-pay | Admitting: Gynecology

## 2013-12-16 ENCOUNTER — Other Ambulatory Visit (HOSPITAL_COMMUNITY)
Admission: RE | Admit: 2013-12-16 | Discharge: 2013-12-16 | Disposition: A | Discharge status: Home / Self Care | Payer: BC Managed Care – PPO | Source: Ambulatory Visit | Attending: Gynecology | Admitting: Gynecology

## 2013-12-16 VITALS — BP 120/70 | Ht 65.0 in | Wt 148.0 lb

## 2013-12-16 DIAGNOSIS — Z01419 Encounter for gynecological examination (general) (routine) without abnormal findings: Secondary | ICD-10-CM

## 2013-12-16 DIAGNOSIS — M8580 Other specified disorders of bone density and structure, unspecified site: Secondary | ICD-10-CM

## 2013-12-16 DIAGNOSIS — M949 Disorder of cartilage, unspecified: Secondary | ICD-10-CM

## 2013-12-16 DIAGNOSIS — N952 Postmenopausal atrophic vaginitis: Secondary | ICD-10-CM

## 2013-12-16 DIAGNOSIS — Z79890 Hormone replacement therapy (postmenopausal): Secondary | ICD-10-CM

## 2013-12-16 DIAGNOSIS — M899 Disorder of bone, unspecified: Secondary | ICD-10-CM

## 2013-12-16 MED ORDER — ESTRADIOL 10 MCG VA TABS: 1.0000 | ORAL_TABLET | VAGINAL | Status: DC

## 2013-12-16 NOTE — Patient Instructions (Signed)
Estradiol vaginal tablets What is this medicine? ESTRADIOL (es tra DYE ole) vaginal tablet is used to help relieve symptoms of vaginal irritation and dryness that occurs in some women during menopause. This medicine may be used for other purposes; ask your health care provider or pharmacist if you have questions. COMMON BRAND NAME(S): Vagifem What should I tell my health care provider before I take this medicine? They need to know if you have any of these conditions: -abnormal vaginal bleeding -blood vessel disease or blood clots -breast, cervical, endometrial, ovarian, liver, or uterine cancer -dementia -diabetes -gallbladder disease -heart disease or recent heart attack -high blood pressure -high cholesterol -high level of calcium in the blood -hysterectomy -kidney disease -liver disease -migraine headaches -protein C deficiency -protein S deficiency -stroke -systemic lupus erythematosus (SLE) -tobacco smoker -an unusual or allergic reaction to estrogens, other hormones, medicines, foods, dyes, or preservatives -pregnant or trying to get pregnant -breast-feeding How should I use this medicine? This medicine is only for use in the vagina. Do not take by mouth. Wash your hands before and after use. Read package directions carefully. Unwrap the pre-filled applicator package. Lie on your back, part and bend your knees. Gently insert the applicator tip high in the vagina and push the plunger to release the tablet into the vagina. Gently remove the applicator. Throw away the applicator after use. Do not use your medicine more often than directed. Finish the full course prescribed by your doctor or health care professional even if you think your condition is better. Do not stop using except on the advice of your doctor or health care professional. Talk to your pediatrician regarding the use of this medicine in children. A patient package insert for the product will be given with each  prescription and refill. Read this sheet carefully each time. The sheet may change frequently. Overdosage: If you think you have taken too much of this medicine contact a poison control center or emergency room at once. NOTE: This medicine is only for you. Do not share this medicine with others. What if I miss a dose? If you miss a dose, take it as soon as you can. If it is almost time for your next dose, take only that dose. Do not take double or extra doses. What may interact with this medicine? Do not take this medicine with any of the following medications: -aromatase inhibitors like aminoglutethimide, anastrozole, exemestane, letrozole, testolactone This medicine may also interact with the following medications: -antibiotics used to treat tuberculosis like rifabutin, rifampin and rifapentene -raloxifene or tamoxifen -warfarin This list may not describe all possible interactions. Give your health care provider a list of all the medicines, herbs, non-prescription drugs, or dietary supplements you use. Also tell them if you smoke, drink alcohol, or use illegal drugs. Some items may interact with your medicine. What should I watch for while using this medicine? Visit your health care professional for regular checks on your progress. You will need a regular breast and pelvic exam. You should also discuss the need for regular mammograms with your health care professional, and follow his or her guidelines. This medicine can make your body retain fluid, making your fingers, hands, or ankles swell. Your blood pressure can go up. Contact your doctor or health care professional if you feel you are retaining fluid. If you have any reason to think you are pregnant; stop taking this medicine at once and contact your doctor or health care professional. Tobacco smoking increases the risk of getting   a blood clot or having a stroke, especially if you are more than 63 years old. You are strongly advised not to  smoke. If you wear contact lenses and notice visual changes, or if the lenses begin to feel uncomfortable, consult your eye care specialist. If you are going to have elective surgery, you may need to stop taking this medicine beforehand. Consult your health care professional for advice prior to scheduling the surgery. What side effects may I notice from receiving this medicine? Side effects that you should report to your doctor or health care professional as soon as possible: -allergic reactions like skin rash, itching or hives, swelling of the face, lips, or tongue -breast tissue changes or discharge -changes in vision -chest pain -confusion, trouble speaking or understanding -dark urine -general ill feeling or flu-like symptoms -light-colored stools -nausea, vomiting -pain, swelling, warmth in the leg -right upper belly pain -severe headaches -shortness of breath -sudden numbness or weakness of the face, arm or leg -trouble walking, dizziness, loss of balance or coordination -unusual vaginal bleeding -yellowing of the eyes or skin Side effects that usually do not require medical attention (report to your doctor or health care professional if they continue or are bothersome): -hair loss -increased hunger or thirst -increased urination -symptoms of vaginal infection like itching, irritation or unusual discharge -unusually weak or tired This list may not describe all possible side effects. Call your doctor for medical advice about side effects. You may report side effects to FDA at 1-800-FDA-1088. Where should I keep my medicine? Keep out of the reach of children. Store at room temperature between 15 and 30 degrees C (59 and 86 degrees F). Throw away any unused medicine after the expiration date. NOTE: This sheet is a summary. It may not cover all possible information. If you have questions about this medicine, talk to your doctor, pharmacist, or health care provider.  2015,  Elsevier/Gold Standard. (2010-08-18 09:08:58)  

## 2013-12-16 NOTE — Progress Notes (Signed)
Karla Erichsenatricia Lee 1950-11-30 [id number]   History:    63 y.o.  who presented to the office today for annual gynecological exam and was having no complaints.Patient for many years was being followed by Dr. Nicholas LoseLomax. Review of patient's record that she brought copies of indicated that she is a gravida 4, para 2, AB 2. Patient with past history of supracervical hysterectomy for abnormal uterine bleeding unresponsive to conservative therapy. 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 Patient with history of hypercholesterolemia on simvastatin 10 mg daily. Patient's  Last bone density study was here in our office in 2014 demonstrated her lowest T. score was -2.0 at the AP spine and -2.0 at the right femoral neck and normal FRAX analysis. 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 Patient denies any recent outbreak.patient is being followed by an allergist for her atopic dermatitis. Patient has been on vaginal estrogen twice a week for vaginal atrophy. She is sexually active.    Past medical history,surgical history, family history and social history were all reviewed and documented in the EPIC chart.  Gynecologic History No LMP recorded. Patient has had a hysterectomy. Contraception: status post hysterectomy Last Pap: 2014. Results were: normal Last mammogram: 2015. Results were: normal  Obstetric History OB History  Gravida Para Term Preterm AB SAB TAB Ectopic Multiple Living  4 2   2 2    2     # Outcome Date GA Lbr Len/2nd Weight Sex Delivery Anes PTL Lv  4 SAB           3 SAB           2 PAR           1 PAR                ROS: A ROS was performed and pertinent positives and negatives are included in the history.  GENERAL: No fevers or chills. HEENT: No change in vision, no earache, sore throat or sinus congestion. NECK: No pain or  stiffness. CARDIOVASCULAR: No chest pain or pressure. No palpitations. PULMONARY: No shortness of breath, cough or wheeze. GASTROINTESTINAL: No abdominal pain, nausea, vomiting or diarrhea, melena or bright red blood per rectum. GENITOURINARY: No urinary frequency, urgency, hesitancy or dysuria. MUSCULOSKELETAL: No joint or muscle pain, no back pain, no recent trauma. DERMATOLOGIC: No rash, no itching, no lesions. ENDOCRINE: No polyuria, polydipsia, no heat or cold intolerance. No recent change in weight. HEMATOLOGICAL: No anemia or easy bruising or bleeding. NEUROLOGIC: No headache, seizures, numbness, tingling or weakness. PSYCHIATRIC: No depression, no loss of interest in normal activity or change in sleep pattern.     Exam: chaperone present  BP 120/70  Ht 5\' 5"  (1.651 m)  Wt 148 lb (67.132 kg)  BMI 24.63 kg/m2  Body mass index is 24.63 kg/(m^2).  General appearance : Well developed well nourished female. No acute distress HEENT: Neck supple, trachea midline, no carotid bruits, no thyroidmegaly Lungs: Clear to auscultation, no rhonchi or wheezes, or rib retractions  Heart: Regular rate and rhythm, no murmurs or gallops Breast:Examined in sitting and supine position were symmetrical in appearance, no palpable masses or tenderness,  no skin retraction, no nipple inversion, no nipple discharge, no skin discoloration, no axillary or supraclavicular lymphadenopathy Abdomen: no palpable masses or tenderness, no rebound or guarding Extremities: no edema or skin discoloration or  tenderness  Pelvic:  Bartholin, Urethra, Skene Glands: Within normal limits             Vagina: No gross lesions or discharge  Cervix: No gross lesions or discharge  Uterus absent  Adnexa  Without masses or tenderness  Anus and perineum  normal   Rectovaginal  normal sphincter tone without palpated masses or tenderness             Hemoccult will provide     Assessment/Plan:  63 y.o. will be switched from the  compound estrogen vaginal tube Vagifem 10 mcg twice a week since her insurance comfortable coverage. She had liver function test done early this she are which was normal so the only lab today are as follows: CBC, TSH, hemoglobin A1c, and urinalysis. Pap smear was done today. We discussed importance of calcium vitamin D and regular exercise for osteoporosis prevention. We discussed importance of monthly self breast examination. Patient not interested in the Tdap vaccine.  Note: This dictation was prepared with  Dragon/digital dictation along withSmart phrase technology. Any transcriptional errors that result from this process are unintentional.   Ok Edwards MD, 2:40 PM 12/16/2013

## 2013-12-17 LAB — URINALYSIS W MICROSCOPIC + REFLEX CULTURE
BACTERIA UA: NONE SEEN
Bilirubin Urine: NEGATIVE
Casts: NONE SEEN
Crystals: NONE SEEN
Glucose, UA: NEGATIVE mg/dL
Hgb urine dipstick: NEGATIVE
Ketones, ur: NEGATIVE mg/dL
Nitrite: NEGATIVE
PROTEIN: NEGATIVE mg/dL
Specific Gravity, Urine: 1.006 (ref 1.005–1.030)
UROBILINOGEN UA: 0.2 mg/dL (ref 0.0–1.0)
pH: 6 (ref 5.0–8.0)

## 2013-12-18 LAB — URINE CULTURE

## 2013-12-18 LAB — CYTOLOGY - PAP

## 2013-12-25 ENCOUNTER — Telehealth: Payer: Self-pay | Admitting: Anesthesiology

## 2014-03-05 ENCOUNTER — Encounter: Payer: Self-pay | Admitting: Gynecology

## 2014-03-05 ENCOUNTER — Other Ambulatory Visit: Payer: Self-pay | Admitting: Gynecology

## 2014-03-05 ENCOUNTER — Telehealth: Payer: Self-pay

## 2014-03-05 DIAGNOSIS — Z01419 Encounter for gynecological examination (general) (routine) without abnormal findings: Secondary | ICD-10-CM

## 2014-03-05 DIAGNOSIS — E7800 Pure hypercholesterolemia, unspecified: Secondary | ICD-10-CM

## 2014-03-05 MED ORDER — SIMVASTATIN 10 MG PO TABS: 10.0000 mg | ORAL_TABLET | Freq: Every day | ORAL | Status: DC

## 2014-03-05 NOTE — Telephone Encounter (Signed)
Patient sent My Chart email:  1.) I need a refill for Simvastatin through [facility]. Would it be possible to have in called in for six or twelve months, so I don't have to bother you?   2.) I left the office after my July appointment without having my blood work done. I would be happy to get that done, if you can resubmit the work order. (Dr. Glenetta HewJF you had ordered CBC, TSH, Hgb A1C and u/a)  Thanks so much.... Karla Lee [phone] cell

## 2014-03-05 NOTE — Telephone Encounter (Signed)
Rx called in. Lab orders put in. I emailed patient back and she replied that she will call this afternoon and make a lab appointment.

## 2014-03-05 NOTE — Telephone Encounter (Signed)
Please call a prescription for simvastatin 10 mg 1 by mouth daily. Call in 30 tablets with 11 refills. Patient will need to come into the office in a fasting state for the following annual labs: CBC, fasting lipid profile, comprehensive metabolic panel, TSH, and urinalysis

## 2014-03-06 ENCOUNTER — Other Ambulatory Visit: Payer: Self-pay | Admitting: Dermatology

## 2014-03-31 ENCOUNTER — Encounter: Payer: Self-pay | Admitting: Gynecology

## 2014-04-29 HISTORY — PX: SHOULDER SURGERY: SHX246

## 2014-05-21 ENCOUNTER — Ambulatory Visit: Payer: BC Managed Care – PPO | Attending: Orthopaedic Surgery

## 2014-05-21 DIAGNOSIS — M25611 Stiffness of right shoulder, not elsewhere classified: Secondary | ICD-10-CM | POA: Insufficient documentation

## 2014-05-21 DIAGNOSIS — M25511 Pain in right shoulder: Secondary | ICD-10-CM | POA: Diagnosis present

## 2014-05-22 ENCOUNTER — Ambulatory Visit: Payer: BC Managed Care – PPO

## 2014-05-22 DIAGNOSIS — M25511 Pain in right shoulder: Secondary | ICD-10-CM | POA: Diagnosis not present

## 2014-05-27 ENCOUNTER — Ambulatory Visit: Payer: BC Managed Care – PPO | Admitting: Physical Therapy

## 2014-05-27 DIAGNOSIS — M25511 Pain in right shoulder: Secondary | ICD-10-CM | POA: Diagnosis not present

## 2014-06-02 ENCOUNTER — Ambulatory Visit: Payer: BLUE CROSS/BLUE SHIELD | Admitting: Rehabilitation

## 2014-06-04 ENCOUNTER — Ambulatory Visit: Payer: BLUE CROSS/BLUE SHIELD | Attending: Orthopaedic Surgery

## 2014-06-04 DIAGNOSIS — M25611 Stiffness of right shoulder, not elsewhere classified: Secondary | ICD-10-CM | POA: Insufficient documentation

## 2014-06-04 DIAGNOSIS — M25511 Pain in right shoulder: Secondary | ICD-10-CM | POA: Diagnosis present

## 2014-06-05 ENCOUNTER — Ambulatory Visit: Payer: BLUE CROSS/BLUE SHIELD

## 2014-06-05 DIAGNOSIS — M25511 Pain in right shoulder: Secondary | ICD-10-CM | POA: Diagnosis not present

## 2014-06-09 ENCOUNTER — Ambulatory Visit: Payer: BLUE CROSS/BLUE SHIELD

## 2014-06-09 DIAGNOSIS — M25511 Pain in right shoulder: Secondary | ICD-10-CM | POA: Diagnosis not present

## 2014-06-12 ENCOUNTER — Ambulatory Visit: Payer: BLUE CROSS/BLUE SHIELD | Admitting: Rehabilitation

## 2014-06-12 DIAGNOSIS — M25511 Pain in right shoulder: Secondary | ICD-10-CM | POA: Diagnosis not present

## 2014-06-16 ENCOUNTER — Ambulatory Visit: Payer: BLUE CROSS/BLUE SHIELD

## 2014-06-16 DIAGNOSIS — M25511 Pain in right shoulder: Secondary | ICD-10-CM | POA: Diagnosis not present

## 2014-06-19 ENCOUNTER — Ambulatory Visit: Payer: BLUE CROSS/BLUE SHIELD | Admitting: Rehabilitation

## 2014-06-19 DIAGNOSIS — M25511 Pain in right shoulder: Secondary | ICD-10-CM | POA: Diagnosis not present

## 2014-06-23 ENCOUNTER — Ambulatory Visit: Payer: BLUE CROSS/BLUE SHIELD

## 2014-06-23 DIAGNOSIS — M25511 Pain in right shoulder: Secondary | ICD-10-CM | POA: Diagnosis not present

## 2014-06-26 ENCOUNTER — Ambulatory Visit: Payer: BLUE CROSS/BLUE SHIELD

## 2014-06-26 DIAGNOSIS — M25511 Pain in right shoulder: Secondary | ICD-10-CM | POA: Diagnosis not present

## 2014-06-30 ENCOUNTER — Ambulatory Visit: Payer: BLUE CROSS/BLUE SHIELD | Attending: Orthopaedic Surgery | Admitting: Rehabilitation

## 2014-06-30 DIAGNOSIS — M25611 Stiffness of right shoulder, not elsewhere classified: Secondary | ICD-10-CM | POA: Diagnosis not present

## 2014-06-30 DIAGNOSIS — M25511 Pain in right shoulder: Secondary | ICD-10-CM | POA: Diagnosis not present

## 2014-07-03 ENCOUNTER — Ambulatory Visit: Payer: BLUE CROSS/BLUE SHIELD

## 2014-07-03 DIAGNOSIS — M25511 Pain in right shoulder: Secondary | ICD-10-CM | POA: Diagnosis not present

## 2014-07-07 ENCOUNTER — Ambulatory Visit: Payer: BLUE CROSS/BLUE SHIELD | Admitting: Physical Therapy

## 2014-07-07 DIAGNOSIS — M25511 Pain in right shoulder: Secondary | ICD-10-CM | POA: Diagnosis not present

## 2014-07-09 ENCOUNTER — Encounter: Payer: BLUE CROSS/BLUE SHIELD | Admitting: Physical Therapy

## 2014-07-10 ENCOUNTER — Ambulatory Visit: Payer: BLUE CROSS/BLUE SHIELD | Admitting: Physical Therapy

## 2014-07-10 DIAGNOSIS — M25511 Pain in right shoulder: Secondary | ICD-10-CM | POA: Diagnosis not present

## 2014-07-11 ENCOUNTER — Other Ambulatory Visit: Payer: Self-pay

## 2014-07-11 DIAGNOSIS — Z1231 Encounter for screening mammogram for malignant neoplasm of breast: Secondary | ICD-10-CM

## 2014-07-21 ENCOUNTER — Ambulatory Visit: Payer: BLUE CROSS/BLUE SHIELD

## 2014-07-21 DIAGNOSIS — M25511 Pain in right shoulder: Secondary | ICD-10-CM | POA: Diagnosis not present

## 2014-07-21 DIAGNOSIS — M25611 Stiffness of right shoulder, not elsewhere classified: Secondary | ICD-10-CM

## 2014-07-21 NOTE — Therapy (Signed)
[facility] [address]  [address] [place], [place], [address] Phone: [phone]   Fax:  [phone]  Physical Therapy Treatment  Patient Details  Name: Karla Lee MRN: [id number] Date of Birth: 29-Jan-1951 Referring Provider:  Kathryne Hitch*  Encounter Date: 07/21/2014      PT End of Session - 07/21/14 0851    Visit Number 16   Number of Visits 21   Date for PT Re-Evaluation 08/08/14   PT Start Time 0810   PT Stop Time 0850   PT Time Calculation (min) 40 min   Activity Tolerance Patient tolerated treatment well   Behavior During Therapy [facility] for tasks assessed/performed      Past Medical History  Diagnosis Date  . High cholesterol     Past Surgical History  Procedure Laterality Date  . Cervical biopsy  w/ loop electrode excision      2003  . Abdominal hysterectomy  1999    SUPRACERVICAL HYSTERECTOMY     There were no vitals taken for this visit.  Visit Diagnosis:  Right shoulder pain  Shoulder stiffness, right      Subjective Assessment - 07/21/14 0810    Currently in Pain? Yes   Pain Score 2    Pain Location Shoulder   Pain Orientation Right   Pain Descriptors / Indicators Tender   Pain Type Chronic pain   Pain Onset More than a month ago   Pain Frequency Intermittent   Aggravating Factors  exercises   Pain Relieving Factors ice          [facility] PT Assessment - 07/21/14 0001    AROM   Right Shoulder Flexion 143 Degrees   Right Shoulder Internal Rotation 85 Degrees   Right Shoulder External Rotation 65 Degrees   Right Shoulder Horizontal ABduction 145 Degrees   PROM   Right Shoulder Flexion 150 Degrees   Right Shoulder ABduction 165 Degrees                  [facility] Adult PT Treatment/Exercise - 07/21/14 0001    Exercises   Exercises Shoulder   Shoulder Exercises: Supine   Flexion Strengthening;15 reps;Weights;Other (comment)  chest press with cane and lift  overhead   Shoulder Flexion Weight (lbs) 2   Shoulder Exercises: Seated   Horizontal ABduction Strengthening;Both;15 reps;Theraband   Theraband Level (Shoulder Horizontal ABduction) Level 1 (Yellow)   External Rotation Strengthening;Both;15 reps;Theraband   Theraband Level (Shoulder External Rotation) Level 1 (Yellow)   Shoulder Exercises: Sidelying   External Rotation Strengthening;Right;12 reps;Weights   External Rotation Weight (lbs) 2   ABduction Strengthening;Right;15 reps;Weights   ABduction Weight (lbs) 2   Shoulder Exercises: Standing   Row Strengthening;Both;15 reps;Theraband   Theraband Level (Shoulder Row) Level 2 (Red)   Shoulder Exercises: Stretch   Corner Stretch 5 reps;20 seconds   Wall Stretch - Flexion 5 reps;10 seconds  rolling green ball up the wall   Other Shoulder Stretches child's pose lateral stretch walking hands to left x 4 reps 20 seconds   Other Shoulder Stretches right upper trap/scalene stretch 3 x 20 sec   Manual Therapy   Manual Therapy Myofascial release;Manual Traction   Myofascial Release to right Upper trap and cervical paraspinal x 10 min w/TPR technique   Manual Traction 2 minutes distraction to cervical spine                     PT Long Term Goals - 07/21/14  [id number]    PT LONG TERM GOAL #1   Title Patient will be independent with HEP for strength and mobility. (08/08/14)   Time 4   Period Weeks   Status On-going   PT LONG TERM GOAL #2   Title Patient to report pain no greater than 5/10 with daily activities.  (08/08/14)   Time 4   Period Weeks   Status On-going   PT LONG TERM GOAL #3   Title Patient to demonstrate functional improvement on FOTO to no greater than 35% limitation.  (08/08/14)   Time 4   Period Weeks   Status On-going               Plan - 07/21/14 [id number]    Clinical Impression Statement Patient continues with limitations in soft tissue in cervical spine.  Though reports did not do exercises while on  vacation last week does have current HEP and will resume.  Did not lose ROM, but tightness evident in c-spine and periscapular area.  Continue skilled PT to goals.   Pt will benefit from skilled therapeutic intervention in order to improve on the following deficits Decreased range of motion;Improper body mechanics;Increased muscle spasms;Pain;Impaired UE functional use;Increased fascial restricitons;Decreased strength;Decreased mobility   Rehab Potential Good   PT Frequency 2x / week   PT Duration 4 weeks   PT Treatment/Interventions ADLs/Self Care Home Management;Ultrasound;Scar mobilization;Neuromuscular re-education;Functional mobility training;Patient/family education;Passive range of motion;Dry needling;Therapeutic activities;Cryotherapy;Electrical Stimulation;Therapeutic exercise;Manual techniques;Moist Heat   PT Next Visit Plan Continue strengthening and manual/stretching to cervical and periscapular area   Consulted and Agree with Plan of Care Patient        Problem List Patient Active Problem List   Diagnosis Date Noted  . Hypercholesterolemia 12/10/2012  . Weight gain 12/10/2012  . Vaginal atrophy 12/10/2012  . Osteopenia 12/10/2012    Kutter Schnepf,CYNDI 07/21/2014, 8:59 AM  Sheran Lawlessyndi Maimuna Leaman, PT 07/21/2014   [facility] [address]  [address] [place], [place], [address] Phone: [phone]   Fax:  [phone]

## 2014-07-23 ENCOUNTER — Ambulatory Visit: Payer: BLUE CROSS/BLUE SHIELD | Admitting: Rehabilitation

## 2014-07-28 ENCOUNTER — Ambulatory Visit: Payer: BLUE CROSS/BLUE SHIELD

## 2014-07-28 DIAGNOSIS — M25511 Pain in right shoulder: Secondary | ICD-10-CM | POA: Diagnosis not present

## 2014-07-28 DIAGNOSIS — M25611 Stiffness of right shoulder, not elsewhere classified: Secondary | ICD-10-CM

## 2014-07-28 NOTE — Therapy (Signed)
[facility] [address]  [address] [place], [place], [address] Phone: [phone]   Fax:  [phone]  Physical Therapy Treatment  Patient Details  Name: Karla Lee MRN: [id number] Date of Birth: [birth year] Referring Provider:  Kathryne Lee*  Encounter Date: 07/28/2014      PT End of Session - 07/28/14 1355    Visit Number 17   Number of Visits 21   Date for PT Re-Evaluation 08/08/14   PT Start Time 1347   PT Stop Time 1440   PT Time Calculation (min) 53 min   Activity Tolerance Patient tolerated treatment well   Behavior During Therapy [facility] for tasks assessed/performed      Past Medical History  Diagnosis Date  . High cholesterol     Past Surgical History  Procedure Laterality Date  . Cervical biopsy  w/ loop electrode excision      2003  . Abdominal hysterectomy  1999    SUPRACERVICAL HYSTERECTOMY     There were no vitals taken for this visit.  Visit Diagnosis:  Right shoulder pain  Shoulder stiffness, right      Subjective Assessment - 07/28/14 1351    Symptoms Frustrating that exercises still can cause neck spasms, but better when doing exercises at gym without resistance.   Currently in Pain? Yes   Pain Score 2    Pain Location Shoulder   Pain Orientation Right   Pain Descriptors / Indicators Tender   Pain Type Chronic pain   Pain Onset More than a month ago   Aggravating Factors  exercises   Pain Relieving Factors no pain at rest          [facility] PT Assessment - 07/28/14 0001    AROM   AROM Assessment Site Shoulder   Right/Left Shoulder Right   Right Shoulder Flexion 141 Degrees   Right Shoulder ABduction 154 Degrees   Right Shoulder Internal Rotation 80 Degrees   Right Shoulder External Rotation 67 Degrees                  [facility] Adult PT Treatment/Exercise - 07/28/14 1352    Shoulder Exercises: Supine   Protraction Strengthening;10 reps;Weights   Protraction Weight (lbs) 2#   Shoulder Exercises: Prone   Horizontal ABduction 1 Strengthening;Right;10 reps;Weights   Horizontal ABduction 1 Weight (lbs) 2#   Shoulder Exercises: Sidelying   External Rotation Strengthening;Right;12 reps;Weights   External Rotation Weight (lbs) 2   ABduction Strengthening;Right;15 reps;Weights   ABduction Weight (lbs) 2   Shoulder Exercises: Standing   Horizontal ABduction Both;10 reps;Theraband   Theraband Level (Shoulder Horizontal ABduction) Level 1 (Yellow)   External Rotation Strengthening;Both;10 reps;Theraband   Theraband Level (Shoulder External Rotation) Level 1 (Yellow)   Extension Strengthening;10 reps;Weights   Extension Weight (lbs) 2#   Row Strengthening;10 reps;Theraband   Theraband Level (Shoulder Row) Level 1 (Yellow)   Shoulder Exercises: Therapy Ball   Flexion 10 reps  rolling ball up wall   Shoulder Exercises: ROM/Strengthening   UBE (Upper Arm Bike) Level 1.8; 2' fwd, 2' rev   Wall Pushups 10 reps   Shoulder Exercises: Lawyer 20 seconds;3 reps   Modalities   Modalities Moist Heat   Moist Heat Therapy   Number Minutes Moist Heat 10 Minutes   Moist Heat Location Shoulder  right in left sidelying   Manual Therapy   Manual Therapy Myofascial release;Massage   Massage STM to right upper trap and periscapular  muscles   Myofascial Release to right Upper trap and cervical paraspinal x 10 min w/TPR technique                     PT Long Term Goals - 07/28/14 1458    PT LONG TERM GOAL #1   Title Patient will be independent with HEP for strength and mobility. (08/08/14)   Status On-going   PT LONG TERM GOAL #2   Title Patient to report pain no greater than 5/10 with daily activities.  (08/08/14)   Status On-going   PT LONG TERM GOAL #3   Title Patient to demonstrate functional improvement on FOTO to no greater than 35% limitation.  (08/08/14)   Status On-going               Plan -  07/28/14 1453    Clinical Impression Statement Progressing with easier lifting with weights.  STill some burning with rows and posterior movements, but overall feels stronger,  Has some issues with weights on her own.   PT Next Visit Plan Continue strengthening and manual/stretching to cervical and periscapular area   Consulted and Agree with Plan of Care Patient        Problem List Patient Active Problem List   Diagnosis Date Noted  . Hypercholesterolemia 12/10/2012  . Weight gain 12/10/2012  . Vaginal atrophy 12/10/2012  . Osteopenia 12/10/2012    Karla Lee,Karla Lee 07/28/2014, 2:59 PM  Karla Lee, PT  [facility] [place] [address]  [address] [place], [place], [address] Phone: [phone]   Fax:  [phone]

## 2014-07-31 ENCOUNTER — Ambulatory Visit: Payer: BLUE CROSS/BLUE SHIELD | Attending: Orthopaedic Surgery | Admitting: Rehabilitation

## 2014-07-31 DIAGNOSIS — M25511 Pain in right shoulder: Secondary | ICD-10-CM | POA: Diagnosis not present

## 2014-07-31 DIAGNOSIS — M25611 Stiffness of right shoulder, not elsewhere classified: Secondary | ICD-10-CM | POA: Diagnosis not present

## 2014-07-31 NOTE — Therapy (Signed)
[facility] [address]  [address] [place], [place], [address] Phone: [phone]   Fax:  [phone]  Physical Therapy Treatment  Patient Details  Name: Karla Erichsenatricia Thaw MRN: [id number] Date of Birth: 1950-09-02 Referring Provider:  Kathryne HitchBlackman, Christopher Y*  Encounter Date: 07/31/2014      PT End of Session - 07/31/14 0842    Visit Number 18   Number of Visits 21   Date for PT Re-Evaluation 08/08/14   PT Start Time 0800   PT Stop Time 0844   PT Time Calculation (min) 44 min   Activity Tolerance Patient tolerated treatment well   Behavior During Therapy [facility] for tasks assessed/performed      Past Medical History  Diagnosis Date  . High cholesterol     Past Surgical History  Procedure Laterality Date  . Cervical biopsy  w/ loop electrode excision      2003  . Abdominal hysterectomy  1999    SUPRACERVICAL HYSTERECTOMY     There were no vitals taken for this visit.  Visit Diagnosis:  Shoulder stiffness, right  Right shoulder pain      Subjective Assessment - 07/31/14 0802    Symptoms Reports she feels fine until she starts doing something then the pain reaches a 2/10. Says are is stiff in the mornings for a couple of hours but she can function and do what she needs to.    Currently in Pain? No/denies                    [facility] - 07/31/14 0803    Shoulder Exercises: Supine   Horizontal ABduction 10 reps;Right   Horizontal ABduction Weight (lbs) 2   Shoulder Exercises: Sidelying   External Rotation Strengthening;10 reps   External Rotation Weight (lbs) 2   Flexion Strengthening;Right;10 reps   Flexion Weight (lbs) 2   ABduction Strengthening;Right;10 reps   ABduction Weight (lbs) 2   Shoulder Exercises: Standing   Horizontal ABduction Both;10 reps   Theraband Level (Shoulder Horizontal ABduction) Level 2 (Red)   External Rotation Strengthening;10 reps   Theraband Level (Shoulder External Rotation) Level 2 (Red)   Extension Strengthening;10 reps   Theraband Level (Shoulder Extension) Level 2 (Red)   Row Strengthening;10 reps   Theraband Level (Shoulder Row) Level 2 (Red)   Shoulder Exercises: Therapy Ball   Flexion 10 reps  pball roll up wall with lift off   Shoulder Exercises: ROM/Strengthening   UBE (Upper Arm Bike) level 2.0 2' fwd, 2' bwd   Wall Pushups 10 reps  orange pball on wall   Other ROM/Strengthening Exercises narrow pulldown 15# x 10 with pause for stretch at top   Shoulder Exercises: Stretch   Corner Stretch 3 reps;20 seconds  in doorway   Modalities   Modalities Cryotherapy   Moist Heat Therapy   Number Minutes Moist Heat 5 Minutes   Moist Heat Location Shoulder  in a seated position   Manual Therapy   Manual Therapy Massage;Myofascial release   Massage STM to Rt upper trap, cervical paraspinals, pec.    Myofascial Release to Rt upper trap                     PT Long Term Goals - 07/28/14 1458    PT LONG TERM GOAL #1   Title Patient will be independent with HEP for strength and mobility. (08/08/14)   Status On-going   PT LONG TERM  GOAL #2   Title Patient to report pain no greater than 5/10 with daily activities.  (08/08/14)   Status On-going   PT LONG TERM GOAL #3   Title Patient to demonstrate functional improvement on FOTO to no greater than 35% limitation.  (08/08/14)   Status On-going               Plan - 07/31/14 0843    Clinical Impression Statement Good progress with more weight exercises, and able to tolerate more resistance with rows and extensions. No neck spasms today but tenderness and trigger points noted.    PT Next Visit Plan Continue with strengthening.    Consulted and Agree with Plan of Care Patient        Problem List Patient Active Problem List   Diagnosis Date Noted  . Hypercholesterolemia 12/10/2012  . Weight gain 12/10/2012  . Vaginal atrophy 12/10/2012  .  Osteopenia 12/10/2012    Ronney Lion, PTA 07/31/2014, 8:44 AM  [facility] [address]  [address] [place], [place], [address] Phone: [phone]   Fax:  [phone]

## 2014-08-04 ENCOUNTER — Ambulatory Visit: Payer: BLUE CROSS/BLUE SHIELD | Admitting: Rehabilitation

## 2014-08-04 DIAGNOSIS — M25511 Pain in right shoulder: Secondary | ICD-10-CM | POA: Diagnosis not present

## 2014-08-04 DIAGNOSIS — M25611 Stiffness of right shoulder, not elsewhere classified: Secondary | ICD-10-CM

## 2014-08-04 NOTE — Therapy (Signed)
[facility] [address]  [address] [place], [place], [address] Phone: [phone]   Fax:  [phone]  Physical Therapy Treatment  Patient Details  Name: Karla Lee MRN: [id number] Date of Birth: 07-01-1950 Referring Provider:  Kathryne Hitch*  Encounter Date: 08/04/2014      PT End of Session - 08/04/14 0758    Visit Number 19   Number of Visits 21   Date for PT Re-Evaluation 08/08/14   PT Start Time 0800   PT Stop Time 0845   PT Time Calculation (min) 45 min   Activity Tolerance Patient tolerated treatment well   Behavior During Therapy [facility] for tasks assessed/performed      Past Medical History  Diagnosis Date  . High cholesterol     Past Surgical History  Procedure Laterality Date  . Cervical biopsy  w/ loop electrode excision      2003  . Abdominal hysterectomy  1999    SUPRACERVICAL HYSTERECTOMY     There were no vitals taken for this visit.  Visit Diagnosis:  Right shoulder pain  Shoulder stiffness, right      Subjective Assessment - 08/04/14 0805    Symptoms Working in the yard on Sunday a lot and feels some pain today. Pain increases with daily activities especially with vacuuming and moving the arm side to side. Reports she is tired of babying it and is trying to act as normal as possible but limited by pain. Has been going to the gym on her off days from therapy to continue with strengthening.    Currently in Pain? Yes   Pain Score 2    Pain Location Shoulder   Pain Orientation Right   Pain Descriptors / Indicators Tightness  pulling   Pain Type Chronic pain   Pain Onset More than a month ago   Pain Frequency Intermittent          [facility] PT Assessment - 08/04/14 0001    ROM / Strength   AROM / PROM / Strength AROM;Strength   AROM   AROM Assessment Site Shoulder   Right/Left Shoulder Right   Right Shoulder Flexion 148 Degrees   Right Shoulder ABduction 145 Degrees   Right Shoulder Internal Rotation 60 Degrees  Tested in prone then tested Functional IR behind back: to L4   Right Shoulder External Rotation 90 Degrees  Tested in prone then tested Functional ER behind head: Full   Strength   Strength Assessment Site Shoulder   Right/Left Shoulder Right   Right Shoulder Flexion 4/5   Right Shoulder ABduction 4+/5   Right Shoulder Internal Rotation 4+/5   Right Shoulder External Rotation 4+/5                  [facility] Adult PT Treatment/Exercise - 08/04/14 0810    Shoulder Exercises: Prone   Flexion 10 reps;Right  2 sets   Flexion Weight (lbs) 2#   External Rotation 10 reps  2# with 2 sets   Other Prone Exercises Scaption 2x10 2#    Other Prone Exercises Abduction 2x10 2#   Shoulder Exercises: Therapy Ball   Flexion 10 reps  pball roll up wall x10 then with lift off x10   Shoulder Exercises: ROM/Strengthening   UBE (Upper Arm Bike) level 2.5 2' fwd, 2' bwd   Modalities   Modalities Cryotherapy   Moist Heat Therapy   Number Minutes Moist Heat 5 Minutes   Moist Heat Location Shoulder  in  seated   Manual Therapy   Manual Therapy Massage   Massage STM to supraspinatus, upper trap, pec   Myofascial Release To Rt supraspinatus                     PT Long Term Goals - 07/28/14 1458    PT LONG TERM GOAL #1   Title Patient will be independent with HEP for strength and mobility. (08/08/14)   Status On-going   PT LONG TERM GOAL #2   Title Patient to report pain no greater than 5/10 with daily activities.  (08/08/14)   Status On-going   PT LONG TERM GOAL #3   Title Patient to demonstrate functional improvement on FOTO to no greater than 35% limitation.  (08/08/14)   Status On-going               Plan - 08/04/14 0840    Clinical Impression Statement Continued improvements with strength and ROM. Still notes tightness/cramping to Rt upper trap and supraspinatus that can be resolved with STM/TPR to the same. Has tolerated  more strengthening exercises well and become independent with gym program as well. Still has some LOM and strength deficits.    Rehab Potential Good   PT Next Visit Plan See what MD says.   Consulted and Agree with Plan of Care Patient        Problem List Patient Active Problem List   Diagnosis Date Noted  . Hypercholesterolemia 12/10/2012  . Weight gain 12/10/2012  . Vaginal atrophy 12/10/2012  . Osteopenia 12/10/2012    Ronney LionUCKER, Tamila Gaulin J, PTA 08/04/2014, 8:46 AM  [facility] [place] [address]  [address] [place], [place], [address] Phone: [phone]   Fax:  [phone]

## 2014-08-07 ENCOUNTER — Ambulatory Visit: Payer: BLUE CROSS/BLUE SHIELD | Admitting: Physical Therapy

## 2014-08-07 DIAGNOSIS — M25511 Pain in right shoulder: Secondary | ICD-10-CM | POA: Diagnosis not present

## 2014-08-07 DIAGNOSIS — M25611 Stiffness of right shoulder, not elsewhere classified: Secondary | ICD-10-CM

## 2014-08-07 NOTE — Therapy (Deleted)
[facility] [address]  [address] [place], [place], [address] Phone: [phone]   Fax:  [phone]  Physical Therapy Treatment  Patient Details  Name: Karla Lee MRN: [id number] Date of Birth: 1950/12/15 Referring Provider:  Kathryne Hitch*  Encounter Date: 08/07/2014      PT End of Session - 08/07/14 0834    Visit Number 20   Number of Visits 28   Date for PT Re-Evaluation 09/04/14   PT Start Time 0806   PT Stop Time 0907   PT Time Calculation (min) 61 min   Activity Tolerance Patient tolerated treatment well   Behavior During Therapy [facility] for tasks assessed/performed      Past Medical History  Diagnosis Date  . High cholesterol     Past Surgical History  Procedure Laterality Date  . Cervical biopsy  w/ loop electrode excision      2003  . Abdominal hysterectomy  1999    SUPRACERVICAL HYSTERECTOMY     There were no vitals taken for this visit.  Visit Diagnosis:  Shoulder stiffness, right  Right shoulder pain      Subjective Assessment - 08/07/14 1034    Symptoms pt states overall is feeling much better but notes limitation in level of function related to limited R UE mobility which she believes is due to spasms.   Currently in Pain? Yes   Pain Score 2    Pain Location Shoulder   Pain Orientation Right   Multiple Pain Sites No          [facility] PT Assessment - 08/07/14 0001    AROM   AROM Assessment Site Cervical;Shoulder   Cervical Extension 55   Cervical - Right Side Bend 24   Cervical - Left Side Bend 19   Cervical - Right Rotation 64   Cervical - Left Rotation 54   PROM   PROM Assessment Site Shoulder   Right/Left Shoulder Right   Right Shoulder Flexion 154 Degrees   Right Shoulder ABduction 125 Degrees  true ABD without scaption, very tight pecs   Right Shoulder Internal Rotation 45 Degrees   Right Shoulder External Rotation 90 Degrees                           PT Education - 08/07/14 0859    Education provided Yes   Education Details HEP including childs pose, sleeper stretch, corner stretch   Person(s) Educated Patient   Methods Explanation;Demonstration;Handout   Comprehension Verbalized understanding;Returned demonstration             PT Long Term Goals - 08/07/14 1051    PT LONG TERM GOAL #1   Title Patient will be independent with Advanced HEP for strength and mobility by 09/04/14.   Status On-going   PT LONG TERM GOAL #2   Title Patient to report pain no greater than 5/10 with daily activities by 09/04/14   Status On-going   PT LONG TERM GOAL #3   Title Patient to demonstrate functional improvement on FOTO to no greater than 35% limitation by 09/04/14   Status On-going               Plan - 08/07/14 1039    Clinical Impression Statement soft tissue restrictions noted in R Pectorals contributing to limited ABD and Flexion AROM as well as likely contributing to R digits #4-5 intermittent N/T.  Soft tissue restriction also noted latissimus dorsi  restricting shoulder Flexion.  R UT pain seems related to abnormal shoulder mechanics but could be in part due to R sided-cpine issue (facet pain noted with neck AROM assessment today).   Pt will benefit from skilled therapeutic intervention in order to improve on the following deficits Decreased range of motion;Improper body mechanics;Increased muscle spasms;Pain;Impaired UE functional use;Increased fascial restricitons;Decreased strength;Decreased mobility   Rehab Potential Excellent   PT Frequency 2x / week   PT Duration 4 weeks   PT Treatment/Interventions Ultrasound;Neuromuscular re-education;Patient/family education;Passive range of motion;Dry needling;Therapeutic activities;Cryotherapy;Electrical Stimulation;Therapeutic exercise;Manual techniques;Moist Heat;Traction   PT Next Visit Plan continue progressing with stretching progressing AROM and  improving scapulohumeral rhythm   Consulted and Agree with Plan of Care Patient        Problem List Patient Active Problem List   Diagnosis Date Noted  . Hypercholesterolemia 12/10/2012  . Weight gain 12/10/2012  . Vaginal atrophy 12/10/2012  . Osteopenia 12/10/2012    Dajon Lazar PT, OCS 08/07/2014, 10:57 AM  [facility] [place] [address]  [address] [place], [place], [address] Phone: [phone]   Fax:  [phone]

## 2014-08-07 NOTE — Therapy (Signed)
[facility] [address]  [address] [place], [place], [address] Phone: [phone]   Fax:  [phone]  Physical Therapy Treatment  Patient Details  Name: Karla Lee MRN: [id number] Date of Birth: [birth year] Referring Provider:  Kathryne Hitch*  Encounter Date: 08/07/2014      PT End of Session - 08/07/14 0834    Visit Number 20   Number of Visits 28   Date for PT Re-Evaluation 09/04/14   PT Start Time 0806   PT Stop Time 0907   PT Time Calculation (min) 61 min   Activity Tolerance Patient tolerated treatment well   Behavior During Therapy [facility] for tasks assessed/performed      Past Medical History  Diagnosis Date  . High cholesterol     Past Surgical History  Procedure Laterality Date  . Cervical biopsy  w/ loop electrode excision      2003  . Abdominal hysterectomy  1999    SUPRACERVICAL HYSTERECTOMY     There were no vitals taken for this visit.  Visit Diagnosis:  Shoulder stiffness, right  Right shoulder pain      Subjective Assessment - 08/07/14 1034    Symptoms pt states overall is feeling much better but notes limitation in level of function related to limited R UE mobility which she believes is due to spasms.   Currently in Pain? Yes   Pain Score 2    Pain Location Shoulder   Pain Orientation Right   Multiple Pain Sites No          [facility] PT Assessment - 08/07/14 0001    AROM   AROM Assessment Site Cervical;Shoulder   Cervical Extension 55   Cervical - Right Side Bend 24   Cervical - Left Side Bend 19   Cervical - Right Rotation 64   Cervical - Left Rotation 54   PROM   PROM Assessment Site Shoulder   Right/Left Shoulder Right   Right Shoulder Flexion 154 Degrees   Right Shoulder ABduction 125 Degrees  true ABD without scaption, very tight pecs   Right Shoulder Internal Rotation 45 Degrees   Right Shoulder External Rotation 90 Degrees      TODAY'S  TREATMENT: Hooklying on 1/2 foam roll: pec stretch x2', pullover 2# x10 with 3 second hold, bilateral horizontal abduction (with palms facing each other) red TB x10, D2 flexion red TB x10, D2 extension red TB x10 Marjo Bicker pose M/Rt/Lt 3x20 seconds each  Sleeper stretch 3x15 seconds Narrow pulldown with hold at top for stretch 15# x10                      PT Education - 08/07/14 0859    Education provided Yes   Education Details HEP including childs pose, sleeper stretch, corner stretch   Person(s) Educated Patient   Methods Explanation;Demonstration;Handout   Comprehension Verbalized understanding;Returned demonstration             PT Long Term Goals - 08/07/14 1051    PT LONG TERM GOAL #1   Title Patient will be independent with Advanced HEP for strength and mobility by 09/04/14.   Status On-going   PT LONG TERM GOAL #2   Title Patient to report pain no greater than 5/10 with daily activities by 09/04/14   Status On-going   PT LONG TERM GOAL #3   Title Patient to demonstrate functional improvement on FOTO to no greater than 35% limitation by 09/04/14  Status On-going               Plan - 08/07/14 1039    Clinical Impression Statement soft tissue restrictions noted in R Pectorals contributing to limited ABD and Flexion AROM as well as likely contributing to R digits #4-5 intermittent N/T.  Soft tissue restriction also noted latissimus dorsi restricting shoulder Flexion.  R UT pain seems related to abnormal shoulder mechanics but could be in part due to R sided-cpine issue (facet pain noted with neck AROM assessment today).   Pt will benefit from skilled therapeutic intervention in order to improve on the following deficits Decreased range of motion;Improper body mechanics;Increased muscle spasms;Pain;Impaired UE functional use;Increased fascial restricitons;Decreased strength;Decreased mobility   Rehab Potential Excellent   PT Frequency 2x / week   PT Duration  4 weeks   PT Treatment/Interventions Ultrasound;Neuromuscular re-education;Patient/family education;Passive range of motion;Dry needling;Therapeutic activities;Cryotherapy;Electrical Stimulation;Therapeutic exercise;Manual techniques;Moist Heat;Traction   PT Next Visit Plan continue progressing with stretching progressing AROM and improving scapulohumeral rhythm   Consulted and Agree with Plan of Care Patient        Problem List Patient Active Problem List   Diagnosis Date Noted  . Hypercholesterolemia 12/10/2012  . Weight gain 12/10/2012  . Vaginal atrophy 12/10/2012  . Osteopenia 12/10/2012    Ronney LionUCKER, Okie Bogacz J, PTA 08/07/2014, 10:58 AM   Juan QuamHALL, RALPH PT, OCS 08/07/2014 11:02 AM   [facility] [place] [address]  [address] [place], [place], [address] Phone: [phone]   Fax:  [phone]

## 2014-08-11 ENCOUNTER — Encounter: Payer: Self-pay | Admitting: Physical Therapy

## 2014-08-11 ENCOUNTER — Ambulatory Visit: Payer: BLUE CROSS/BLUE SHIELD | Admitting: Physical Therapy

## 2014-08-11 ENCOUNTER — Ambulatory Visit
Admission: RE | Admit: 2014-08-11 | Discharge: 2014-08-11 | Disposition: A | Discharge status: Home / Self Care | Payer: BLUE CROSS/BLUE SHIELD | Source: Ambulatory Visit

## 2014-08-11 DIAGNOSIS — M25511 Pain in right shoulder: Secondary | ICD-10-CM

## 2014-08-11 DIAGNOSIS — M25611 Stiffness of right shoulder, not elsewhere classified: Secondary | ICD-10-CM

## 2014-08-11 DIAGNOSIS — Z1231 Encounter for screening mammogram for malignant neoplasm of breast: Secondary | ICD-10-CM

## 2014-08-11 DIAGNOSIS — M436 Torticollis: Secondary | ICD-10-CM

## 2014-08-11 NOTE — Therapy (Signed)
[facility] [address]  [address] [place], [place], [address] Phone: [phone]   Fax:  [phone]  Physical Therapy Treatment  Patient Details  Name: Karla Lee MRN: [id number] Date of Birth: 12/04/1950 Referring Provider:  Kathryne Hitch*  Encounter Date: 08/11/2014      PT End of Session - 08/11/14 0757    Visit Number 21   Number of Visits 28   Date for PT Re-Evaluation 09/04/14   PT Start Time 0715   PT Stop Time 0817   PT Time Calculation (min) 62 min      Past Medical History  Diagnosis Date  . High cholesterol     Past Surgical History  Procedure Laterality Date  . Cervical biopsy  w/ loop electrode excision      2003  . Abdominal hysterectomy  1999    SUPRACERVICAL HYSTERECTOMY     There were no vitals filed for this visit.  Visit Diagnosis:  Shoulder stiffness, right  Right shoulder pain  Neck stiffness      Subjective Assessment - 08/11/14 0718    Symptoms States had best day in months following last session.  States was able to tolerate some yardwork over the weekend.   Currently in Pain? Yes   Pain Score 2    Pain Location Shoulder   Pain Orientation Right   Multiple Pain Sites No          TODAY'S TREATMENT: MANUAL - R GH PA mobes grade 4 with ER, IR, and Flexion stretching, Grade 4 caudal glides with ABD stretching.  STM with stretching R pectorals.  Seated Grade 4 caudal glide with Shoulder ABD. TherEx - 3-way prayer stretch 2/30" each;  Hooklying on Foam Roll: pec stretch x2', Pullover 5# 2x15, Bilateral Horizontal Abduction Green TB 12x, D2 Flexion Green TB 15x  Narrow pulldown with hold at top for stretch 20#  12x Standing Back to wall with 1/2 Foam Roll to spine: B Shoulder Flexion 2# 10x   Mechanical Traction: C-spine, 20degree pull, 60"/20", 12#/6#, 15'                         PT Long Term Goals - 08/07/14 1051    PT LONG TERM  GOAL #1   Title Patient will be independent with Advanced HEP for strength and mobility by 09/04/14.   Status On-going   PT LONG TERM GOAL #2   Title Patient to report pain no greater than 5/10 with daily activities by 09/04/14   Status On-going   PT LONG TERM GOAL #3   Title Patient to demonstrate functional improvement on FOTO to no greater than 35% limitation by 09/04/14   Status On-going               Plan - 08/11/14 0758    Clinical Impression Statement pt responding welll to focus on c-spine and soft tissue pliability in that reports felt better after last treatment than she has in months.  Tolerated rx well again today.   PT Next Visit Plan continue manual and stretching to pecs and lats, add more manual to c-spine when seen by PT, continue traction   Consulted and Agree with Plan of Care Patient        Problem List Patient Active Problem List   Diagnosis Date Noted  . Hypercholesterolemia 12/10/2012  . Weight gain 12/10/2012  . Vaginal atrophy 12/10/2012  . Osteopenia 12/10/2012  Chandni Gagan PT, OCS 08/11/2014, 8:44 AM  [facility] [place] [address]  [address] [place], [place], [address] Phone: [phone]   Fax:  [phone]

## 2014-08-14 ENCOUNTER — Ambulatory Visit: Payer: BLUE CROSS/BLUE SHIELD | Admitting: Physical Therapy

## 2014-08-14 DIAGNOSIS — M25511 Pain in right shoulder: Secondary | ICD-10-CM

## 2014-08-14 DIAGNOSIS — M25611 Stiffness of right shoulder, not elsewhere classified: Secondary | ICD-10-CM

## 2014-08-14 DIAGNOSIS — M436 Torticollis: Secondary | ICD-10-CM

## 2014-08-14 NOTE — Therapy (Signed)
[facility] [place] [address]  [address] [place], [place], [address] Phone: [phone]   Fax:  [phone]  Physical Therapy Treatment  Patient Details  Name: Karla Erichsenatricia Lee MRN: [id number] Date of Birth: 1951/04/28 Referring Provider:  Kathryne HitchBlackman, Christopher Y*  Encounter Date: 08/14/2014      PT End of Session - 08/14/14 0825    Visit Number 22   Number of Visits 28   Date for PT Re-Evaluation 09/04/14   PT Start Time 0802   PT Stop Time 0900   PT Time Calculation (min) 58 min      Past Medical History  Diagnosis Date  . High cholesterol     Past Surgical History  Procedure Laterality Date  . Cervical biopsy  w/ loop electrode excision      2003  . Abdominal hysterectomy  1999    SUPRACERVICAL HYSTERECTOMY     There were no vitals filed for this visit.  Visit Diagnosis:  Shoulder stiffness, right  Right shoulder pain  Neck stiffness      Subjective Assessment - 08/14/14 0824    Symptoms States pain continues to hover around a 2/10 at most times.   Currently in Pain? Yes   Pain Score 2    Pain Location Shoulder   Pain Orientation Right   Multiple Pain Sites No         TODAY'S TREATMENT: UBE LVL 2.0 90"/90"  MANUAL - R GH PA mobes grade 4 with ER, IR, and Flexion stretching, Grade 4 caudal glides with ABD stretching. STM with stretching R pectorals. L Side-lying R scap mobes with emphasis on retraction and depression due to tendency for elevation and protraction.  TherEx - L Side-lying R GH Horiz ABD 2# 15x, R ER 2# 15x, R ABD 2# 15x  Hooklying on Foam Roll: pec stretch x2', Pullover 6# 15x, Bilateral Horizontal Adduction 2# 15x, B D2 Flexion 2# 15x Low Row 25# 2x10 Standing lat stretch at sink 2x20" each  Standing Back to wall with 1/2 Foam Roll to spine: B Shoulder Flexion 2# 10x, B ABD 2# 10x   Mechanical Traction: C-spine, 20 degree pull, 60"/20", 14#/7#,  15'                          PT Long Term Goals - 08/07/14 1051    PT LONG TERM GOAL #1   Title Patient will be independent with Advanced HEP for strength and mobility by 09/04/14.   Status On-going   PT LONG TERM GOAL #2   Title Patient to report pain no greater than 5/10 with daily activities by 09/04/14   Status On-going   PT LONG TERM GOAL #3   Title Patient to demonstrate functional improvement on FOTO to no greater than 35% limitation by 09/04/14   Status On-going               Problem List Patient Active Problem List   Diagnosis Date Noted  . Hypercholesterolemia 12/10/2012  . Weight gain 12/10/2012  . Vaginal atrophy 12/10/2012  . Osteopenia 12/10/2012    Angellica Maddison PT, OCS 08/14/2014, 9:06 AM  [facility] [place] [address]  [address] [place], [place], [address] Phone: [phone]   Fax:  [phone]

## 2014-08-18 ENCOUNTER — Ambulatory Visit: Payer: BLUE CROSS/BLUE SHIELD | Admitting: Physical Therapy

## 2014-08-18 DIAGNOSIS — M25611 Stiffness of right shoulder, not elsewhere classified: Secondary | ICD-10-CM

## 2014-08-18 DIAGNOSIS — M25511 Pain in right shoulder: Secondary | ICD-10-CM | POA: Diagnosis not present

## 2014-08-18 DIAGNOSIS — M436 Torticollis: Secondary | ICD-10-CM

## 2014-08-18 NOTE — Therapy (Signed)
[facility] [place] [address]  [address] [place], [place], [address] Phone: [phone]   Fax:  [phone]  Physical Therapy Treatment  Patient Details  Name: Karla Erichsenatricia Lee MRN: [id number] Date of Birth: [birth year] Referring Provider:  Kathryne HitchBlackman, Christopher Y*  Encounter Date: 08/18/2014      PT End of Session - 08/18/14 1452    Visit Number 23   Number of Visits 28   Date for PT Re-Evaluation 09/04/14   PT Start Time 1401   PT Stop Time 1500   PT Time Calculation (min) 59 min      Past Medical History  Diagnosis Date  . High cholesterol     Past Surgical History  Procedure Laterality Date  . Cervical biopsy  w/ loop electrode excision      2003  . Abdominal hysterectomy  1999    SUPRACERVICAL HYSTERECTOMY     There were no vitals filed for this visit.  Visit Diagnosis:  Neck stiffness  Shoulder stiffness, right  Right shoulder pain      Subjective Assessment - 08/18/14 1402    Symptoms states used rug steam cleaner at home and work past 2 days and notes some upper shoulder / scapular pain states feels like muscle spasm.  Performed HEP and went to gym over the weekend.   Currently in Pain? Yes   Pain Score 3    Pain Location --  R upper scapula   Pain Orientation Right   Multiple Pain Sites No            [facility] PT Assessment - 08/18/14 0001    AROM   AROM Assessment Site Cervical   Cervical Extension 52   Cervical - Right Side Bend 26   Cervical - Left Side Bend 21   Cervical - Right Rotation 64  R upper scap pain, 72 after manual   Cervical - Left Rotation 68  70 after manual   PROM   Right Shoulder Flexion 166 Degrees   Right Shoulder ABduction 137 Degrees   Right Shoulder Internal Rotation 55 Degrees   Right Shoulder External Rotation 90 Degrees        TODAY'S TREATMENT:  MANUAL - R GH PA mobes grade 4 with ER, IR, and Flexion stretching, Grade 4 caudal glides with ABD  stretching. STM with stretching R pectorals.  Prone PA Grade 3 to mid and upper t-spine.  C-spine B Rotation and SBing mobes grade 3.  STM and Stretch R scalenes (very tight)  Mechanical Traction: C-spine, 20 degree pull, 60"/20", 14#/7#, 10'                        PT Long Term Goals - 08/07/14 1051    PT LONG TERM GOAL #1   Title Patient will be independent with Advanced HEP for strength and mobility by 09/04/14.   Status On-going   PT LONG TERM GOAL #2   Title Patient to report pain no greater than 5/10 with daily activities by 09/04/14   Status On-going   PT LONG TERM GOAL #3   Title Patient to demonstrate functional improvement on FOTO to no greater than 35% limitation by 09/04/14   Status On-going               Plan - 08/18/14 1450    Clinical Impression Statement good improvement in B c-spine AROM following manual and good overall progress in R shoulder PROM lately.  Today's  rx focus on manual to improve c-spine pain and LOM.   PT Next Visit Plan continue manual and stretching to pecs and lats, manual to c-spine when seen by PT, continue traction        Problem List Patient Active Problem List   Diagnosis Date Noted  . Hypercholesterolemia 12/10/2012  . Weight gain 12/10/2012  . Vaginal atrophy 12/10/2012  . Osteopenia 12/10/2012    Kayshawn Ozburn PT, OCS 08/18/2014, 3:40 PM  [facility] [address]  [address] [place], [place], [address] Phone: [phone]   Fax:  [phone]

## 2014-08-20 ENCOUNTER — Ambulatory Visit: Payer: BLUE CROSS/BLUE SHIELD | Admitting: Physical Therapy

## 2014-08-20 DIAGNOSIS — M25511 Pain in right shoulder: Secondary | ICD-10-CM | POA: Diagnosis not present

## 2014-08-20 DIAGNOSIS — M436 Torticollis: Secondary | ICD-10-CM

## 2014-08-20 DIAGNOSIS — M25611 Stiffness of right shoulder, not elsewhere classified: Secondary | ICD-10-CM

## 2014-08-20 NOTE — Therapy (Signed)
[facility] [place] [address]  [address] [place], [place], [address] Phone: [phone]   Fax:  [phone]  Physical Therapy Treatment  Patient Details  Name: Karla Lee MRN: [id number] Date of Birth: November 30, 1950 Referring Provider:  Kathryne HitchBlackman, Christopher Y*  Encounter Date: 08/20/2014      PT End of Session - 08/20/14 0808    Visit Number 24   Number of Visits 28   Date for PT Re-Evaluation 09/04/14   PT Start Time 0804   PT Stop Time 0918   PT Time Calculation (min) 74 min      Past Medical History  Diagnosis Date  . High cholesterol     Past Surgical History  Procedure Laterality Date  . Cervical biopsy  w/ loop electrode excision      2003  . Abdominal hysterectomy  1999    SUPRACERVICAL HYSTERECTOMY     There were no vitals filed for this visit.  Visit Diagnosis:  Right shoulder pain  Neck stiffness  Shoulder stiffness, right      Subjective Assessment - 08/20/14 0806    Symptoms decreased pain today (down to 1/10) despite some additional steam cleaner use since last treatment   Currently in Pain? Yes   Pain Score 1    Pain Location Shoulder   Pain Orientation Right;Anterior;Upper   Multiple Pain Sites No                 TODAY'S TREATMENT:  TherEx - UBE lvl 2.5, 4' Corner Pec Stretch 3x20" (difficulty relaxing upper traps today) TRX Low Row 10x TRX High Row 10x TRX Push-up 10x TRX Bent T 10x TRX Y 10x (notes some R UT pinch with this one)  MANUAL - Prone PA Grade 3 to mid and upper t-spine. R GH PA mobes grade 4 with ER, IR, and Flexion stretching, Grade 4 caudal glides with ABD stretching. STM with stretching R pectorals.  C-spine B Rotation and SBing mobes grade 3, TPR R UT.  Mechanical Traction: C-spine, 20 degree pull, 60"/20", 14#/7#, 15'                   PT Long Term Goals - 08/07/14 1051    PT LONG TERM GOAL #1   Title Patient will be  independent with Advanced HEP for strength and mobility by 09/04/14.   Status On-going   PT LONG TERM GOAL #2   Title Patient to report pain no greater than 5/10 with daily activities by 09/04/14   Status On-going   PT LONG TERM GOAL #3   Title Patient to demonstrate functional improvement on FOTO to no greater than 35% limitation by 09/04/14   Status On-going               Plan - 08/20/14 0854    Clinical Impression Statement performed well with TRX exercises but does display excessive UT activation with pec stretch and with Y exercise.   PT Next Visit Plan continue manual and stretching to pecs and lats, manual to c-spine and scalenes when seen by PT, continue traction        Problem List Patient Active Problem List   Diagnosis Date Noted  . Hypercholesterolemia 12/10/2012  . Weight gain 12/10/2012  . Vaginal atrophy 12/10/2012  . Osteopenia 12/10/2012    Corra Kaine PT, OCS 08/20/2014, 9:18 AM  [facility] [place] [address]  [address] [place], [place], [address] Phone: [phone]  Fax:  [phone]

## 2014-08-26 ENCOUNTER — Ambulatory Visit: Payer: BLUE CROSS/BLUE SHIELD | Admitting: Physical Therapy

## 2014-08-28 ENCOUNTER — Ambulatory Visit: Payer: BLUE CROSS/BLUE SHIELD | Admitting: Rehabilitation

## 2014-09-01 ENCOUNTER — Encounter: Payer: BLUE CROSS/BLUE SHIELD | Admitting: Rehabilitation

## 2014-09-02 ENCOUNTER — Ambulatory Visit: Payer: BLUE CROSS/BLUE SHIELD | Attending: Orthopaedic Surgery | Admitting: Physical Therapy

## 2014-09-02 DIAGNOSIS — M25511 Pain in right shoulder: Secondary | ICD-10-CM | POA: Diagnosis present

## 2014-09-02 DIAGNOSIS — M25611 Stiffness of right shoulder, not elsewhere classified: Secondary | ICD-10-CM | POA: Insufficient documentation

## 2014-09-02 DIAGNOSIS — M436 Torticollis: Secondary | ICD-10-CM

## 2014-09-02 NOTE — Therapy (Signed)
[facility] [place] [address]  [address] [place], [place], [address] Phone: [phone]   Fax:  [phone]  Physical Therapy Treatment  Patient Details  Name: Karla Erichsenatricia Kobrin MRN: [id number] Date of Birth: 1950-12-02 Referring Provider:  Kathryne HitchBlackman, Christopher Y*  Encounter Date: 09/02/2014      PT End of Session - 09/02/14 0720    Visit Number 25   Number of Visits 28   Date for PT Re-Evaluation 09/04/14   PT Start Time 0718   PT Stop Time 0823   PT Time Calculation (min) 65 min      Past Medical History  Diagnosis Date  . High cholesterol     Past Surgical History  Procedure Laterality Date  . Cervical biopsy  w/ loop electrode excision      2003  . Abdominal hysterectomy  1999    SUPRACERVICAL HYSTERECTOMY     There were no vitals filed for this visit.  Visit Diagnosis:  Shoulder stiffness, right  Neck stiffness  Right shoulder pain      Subjective Assessment - 09/02/14 0722    Subjective Pt has not been able to attend therapy lately due to emergency tooth extraction.  She states her shoulder is feeling great, has been able to work out at gym without noting significant limitation "able to do everything".  States neck is much improved but still feels stiff.   Currently in Pain? No/denies            [facility] Assessment - 09/02/14 0001    AROM   AROM Assessment Site Shoulder;Cervical   Right/Left Shoulder Right   Right Shoulder Flexion 150 Degrees   Right Shoulder ABduction 125 Degrees  145 in scaption   Right Shoulder Internal Rotation 45 Degrees  reach to L1   Right Shoulder External Rotation 90 Degrees  reach to T3   Cervical Extension 57   Cervical - Right Side Bend 26   Cervical - Left Side Bend 24   Cervical - Right Rotation 68   Cervical - Left Rotation 69   PROM   Right Shoulder Flexion 162 Degrees   Right Shoulder ABduction 142 Degrees   Right Shoulder Internal Rotation 64 Degrees    Right Shoulder External Rotation 95 Degrees          MANUAL - R GH caudal and AP glides grade 4 with mobes into flexion, ABD, ER, and IR.  STM to R pec with ABD stretching.  Prone PA Grade 3 to mid and upper t-spine.  After TherEx performed B c-spine rotation mobes with emphasis on L rotation to upper c-spine due to sensitivity in this area.  B SB mobe with scalene and UT stretch.  TherEx - UBE lvl 3.0, 4' Hooklying on Foam Roll Pec/T-spine Extension stretch 2', Pullover 7# 15x, Horiz B ADD 2# 10x, B D2 Flexion 2# 10x, B Horiz ABD Blue TB 15x                PT Education - 09/02/14 [phone]    Education provided Yes   Education Details shoulder IR stretch with towel and with hands on counter behind back   Person(s) Educated Patient   Methods Explanation;Demonstration   Comprehension Verbalized understanding;Returned demonstration             PT Long Term Goals - 09/02/14 0827    PT LONG TERM GOAL #1   Title Patient will be independent with Advanced HEP for strength and mobility  by 09/04/14.  she has returned to gym workouts   Status Achieved   PT LONG TERM GOAL #2   Title Patient to report pain no greater than 5/10 with daily activities by 09/04/14  states pain has been no greater than 1/10 lately   Status Achieved               Plan - 09/02/14 0830    Clinical Impression Statement no pain lately; however, she has been taking hydrocodone due to tooth extraction so this could be masking shoulder/neck pain.  Shoulder and neck AROM/PROM display mild tightness due to R pec tightness, R scalene tightness, and mild R neck facet pain.   PT Next Visit Plan re-assess neck ROM, may be ready for d/c?        Problem List Patient Active Problem List   Diagnosis Date Noted  . Hypercholesterolemia 12/10/2012  . Weight gain 12/10/2012  . Vaginal atrophy 12/10/2012  . Osteopenia 12/10/2012    Mikyle Sox PT, OCS 09/02/2014, 8:33 AM  [facility] [address]  [address] [place], [place], [address] Phone: [phone]   Fax:  [phone]

## 2014-09-04 ENCOUNTER — Ambulatory Visit: Payer: BLUE CROSS/BLUE SHIELD | Admitting: Physical Therapy

## 2014-09-04 DIAGNOSIS — M25611 Stiffness of right shoulder, not elsewhere classified: Secondary | ICD-10-CM

## 2014-09-04 DIAGNOSIS — M25511 Pain in right shoulder: Secondary | ICD-10-CM

## 2014-09-04 DIAGNOSIS — M436 Torticollis: Secondary | ICD-10-CM

## 2014-09-04 NOTE — Therapy (Signed)
[facility] [place] [address]  [address] [place], [place], [address] Phone: [phone]   Fax:  [phone]  Physical Therapy Treatment  Patient Details  Name: Karla Lee MRN: [id number] Date of Birth: 31-Jan-1951 Referring Provider:  Kathryne HitchBlackman, Christopher Y*  Encounter Date: 09/04/2014      PT End of Session - 09/04/14 0845    Visit Number 26   Number of Visits 28   Date for PT Re-Evaluation 09/04/14   PT Start Time 0802   PT Stop Time 0845   PT Time Calculation (min) 43 min      Past Medical History  Diagnosis Date  . High cholesterol     Past Surgical History  Procedure Laterality Date  . Cervical biopsy  w/ loop electrode excision      2003  . Abdominal hysterectomy  1999    SUPRACERVICAL HYSTERECTOMY     There were no vitals filed for this visit.  Visit Diagnosis:  Neck stiffness  Right shoulder pain  Shoulder stiffness, right      Subjective Assessment - 09/04/14 0808    Subjective no increased pain following last treatment   Currently in Pain? No/denies       MANUAL - R GH caudal and AP glides grade 4 with mobes into flexion, ABD, ER, and IR. STM to R pec with ABD stretching. Prone PA Grade 3 to mid and upper t-spine.  After TherEx performed B c-spine B SB mobe with scalene and UT stretch.  TherEx - UBE lvl 3.4, 90"/90" Hooklying on Foam Roll Pec/T-spine Extension stretch 2', Pullover 7# 15x,  R D2 Extension Green TB 15x, B D2 Flexion 3# 10x      [facility] PT Assessment - 09/04/14 0001    AROM   Cervical Extension 62   Cervical - Right Side Bend 19   Cervical - Left Side Bend 15   Cervical - Right Rotation 70   Cervical - Left Rotation 68                                PT Long Term Goals - 09/02/14 0827    PT LONG TERM GOAL #1   Title Patient will be independent with Advanced HEP for strength and mobility by 09/04/14.  she has returned to gym workouts   Status Achieved   PT LONG TERM GOAL #2   Title Patient to report pain no greater than 5/10 with daily activities by 09/04/14  states pain has been no greater than 1/10 lately   Status Achieved               Plan - 09/04/14 0850    Clinical Impression Statement pt is to perform HEP and exercise independently over the next 3 weeks then return for f/u at that time to answer any remaining questions and likely discharge at that time   PT Next Visit Plan re-assess, answer questions, likely discharge   Consulted and Agree with Plan of Care Patient        Problem List Patient Active Problem List   Diagnosis Date Noted  . Hypercholesterolemia 12/10/2012  . Weight gain 12/10/2012  . Vaginal atrophy 12/10/2012  . Osteopenia 12/10/2012    Moesha Sarchet PT, OCS 09/04/2014, 11:44 AM  [facility] [place] [address]  [address] [place], [place], [address] Phone: [phone]   Fax:  [phone]

## 2014-09-25 ENCOUNTER — Ambulatory Visit: Payer: BLUE CROSS/BLUE SHIELD | Admitting: Physical Therapy

## 2014-09-25 DIAGNOSIS — M436 Torticollis: Secondary | ICD-10-CM

## 2014-09-25 DIAGNOSIS — M25511 Pain in right shoulder: Secondary | ICD-10-CM | POA: Diagnosis not present

## 2014-09-25 NOTE — Therapy (Signed)
[facility] [place] [address]  [address] [place], [place], [address] Phone: [phone]   Fax:  [phone]  Physical Therapy Treatment  Patient Details  Name: Karla Lee MRN: [id number] Date of Birth: [birth year] Referring Provider:  Kathryne HitchBlackman, Christopher Y*  Encounter Date: 09/25/2014      PT End of Session - 09/25/14 0716    Visit Number 27   Number of Visits 28   PT Start Time 0715   PT Stop Time 0755   PT Time Calculation (min) 40 min      Past Medical History  Diagnosis Date  . High cholesterol     Past Surgical History  Procedure Laterality Date  . Cervical biopsy  w/ loop electrode excision      2003  . Abdominal hysterectomy  1999    SUPRACERVICAL HYSTERECTOMY     There were no vitals filed for this visit.  Visit Diagnosis:  Neck stiffness      Subjective Assessment - 09/25/14 0717    Subjective States has noted some muscle tightness in R neck lately but overall states is doing "fine".  Reports after last treatment "felt like a new person for 2 weeks" but then some mild tightness and discomfort returned to R neck.  Notes some difficulty with turning head to L while driving.   Currently in Pain? Yes   Pain Score 3    Pain Location Neck  and upper scapula   Pain Orientation Right;Lower   Pain Descriptors / Indicators Tightness            [facility] PT Assessment - 09/25/14 0001    AROM   Cervical Extension 62   Cervical - Right Side Bend 25   Cervical - Left Side Bend 19  21 after manual   Cervical - Right Rotation 68  70 after manual   Cervical - Left Rotation 61  68 after manual       TODAY'S TREATMENT: TherEx - UBE lvl 3, 1'/1' Reviewed HEP foam roller for t-spine extension/chest stretch and encouraged pt to use one at gym or purchase one (she has not used one since last treatment here). c-spine AROM assessment  Manual - TPR R pec minor and some into pec major with stretch to  same; TPR and stretch to R UT; C-spine L Rot, L SB, and Retraction mobes grade 3 and some grade 4 to tolerance.                         PT Long Term Goals - 09/02/14 0827    PT LONG TERM GOAL #1   Title Patient will be independent with Advanced HEP for strength and mobility by 09/04/14.  she has returned to gym workouts   Status Achieved   PT LONG TERM GOAL #2   Title Patient to report pain no greater than 5/10 with daily activities by 09/04/14  states pain has been no greater than 1/10 lately   Status Achieved               Plan - 09/25/14 0802    Clinical Impression Statement pt wants to attend another treatment/re-assessment in 3 weeks.  She states she will use foam roller between now and then to see if this helps keep her in pain-free and good mobility state.   PT Next Visit Plan re-assess, answer questions, likely discharge   Consulted and Agree with Plan of Care Patient  Problem List Patient Active Problem List   Diagnosis Date Noted  . Hypercholesterolemia 12/10/2012  . Weight gain 12/10/2012  . Vaginal atrophy 12/10/2012  . Osteopenia 12/10/2012    Mckayla Mulcahey PT, OCS 09/25/2014, 8:04 AM  [facility] [address]  [address] [place], [place], [address] Phone: [phone]   Fax:  [phone]

## 2014-10-16 ENCOUNTER — Ambulatory Visit: Payer: BLUE CROSS/BLUE SHIELD | Attending: Orthopaedic Surgery | Admitting: Physical Therapy

## 2014-10-16 DIAGNOSIS — M436 Torticollis: Secondary | ICD-10-CM

## 2014-10-16 DIAGNOSIS — M25611 Stiffness of right shoulder, not elsewhere classified: Secondary | ICD-10-CM | POA: Diagnosis not present

## 2014-10-16 DIAGNOSIS — M25511 Pain in right shoulder: Secondary | ICD-10-CM | POA: Diagnosis present

## 2014-10-16 NOTE — Therapy (Addendum)
[facility] [address]  [place], [place], [address] Phone: [phone]   Fax:  [phone]  Physical Therapy Treatment  Patient Details  Name: Karla Lee MRN: [id number] Date of Birth: March 21, 1951 Referring Provider:  Mcarthur Rossetti*  Encounter Date: 10/16/2014      PT End of Session - 10/16/14 0720    Visit Number 28   Number of Visits 28   Date for PT Re-Evaluation 10/16/14   PT Start Time 0718   PT Stop Time 0805   PT Time Calculation (min) 47 min      Past Medical History  Diagnosis Date  . High cholesterol     Past Surgical History  Procedure Laterality Date  . Cervical biopsy  w/ loop electrode excision      2003  . Abdominal hysterectomy  1999    SUPRACERVICAL HYSTERECTOMY     There were no vitals filed for this visit.  Visit Diagnosis:  Neck stiffness  Right shoulder pain  Shoulder stiffness, right      Subjective Assessment - 10/16/14 0719    Subjective States feels some stiffness and some tenderness in anterior aspect of R shoulder after exercising at gym.  Also notes some pain after carrying "something heavy like a bag of soil".  "Neck still stiff"   Currently in Pain? Yes   Pain Score --  1-2/10   Pain Location Shoulder   Pain Orientation Right;Anterior   Multiple Pain Sites No            [facility] PT Assessment - 10/16/14 0001    Observation/Other Assessments   Focus on Therapeutic Outcomes (FOTO)  24% limitation   AROM   AROM Assessment Site Shoulder;Cervical   Right/Left Shoulder Right   Right Shoulder Flexion 150 Degrees   Right Shoulder ABduction 138 Degrees  slight scaption   Right Shoulder Internal Rotation 40 Degrees  Reach to R L1, L to T10   Right Shoulder External Rotation 85 Degrees  reach to T2 bilaterally   Cervical Extension 56   Cervical - Right Side Bend 26  "tight"   Cervical - Left Side Bend 17  R neck stretch   Cervical - Right  Rotation 66  "a little tighness"   Cervical - Left Rotation 66  "a little tightness"   PROM   Right Shoulder Internal Rotation 50 Degrees   Right Shoulder External Rotation 95 Degrees        TODAY'S TREATMENT: ROM and MMT assessment Reviewed gym routine and offered some alternatives to overhead exercises Standing B Shoulder ABD 2# 10x Corner Stretch Low Row 25# 15x Chest press 10# 15x FR chest press 6# db 10x 1/2 kneeling One-Arm Row 6# 10x Tricep Kickback 6# 10x         PT Education - 10/16/14 1001    Education provided Yes   Education Details gym exercise guidance (reps, appropriate exercises and intensity)   Person(s) Educated Patient   Methods Explanation;Demonstration   Comprehension Verbalized understanding;Returned demonstration             PT Long Term Goals - 10/16/14 0723    PT LONG TERM GOAL #1   Title Patient will be independent with Advanced HEP for strength and mobility by 09/04/14.   Status Achieved   PT LONG TERM GOAL #2   Title Patient to report pain no greater than 5/10 with daily activities by 09/04/14   Status Achieved   PT LONG TERM  GOAL #3   Title Patient to demonstrate functional improvement on FOTO to no greater than 35% limitation by 09/04/14  24% limitation   Status Achieved               Plan - 10/16/14 1003    Clinical Impression Statement pt states has noted some increased pain lately but in discussion with her realize pain is likely due to some overhead exercises she has been performing at the gym.  We reviewed alternative exercises that she can perform without straining her shoulder.  Also reviewed appropriate posture, intensity, and repititions with her workouts.  Overall, pt with strong understanding, high level of motivation, good AROM.  I advised pt I will keep her chart open for next 30 days in case she requires further PT intervention, but this seems unlikely.   PT Next Visit Plan pt on hold next 30 days, if does not  return we will discharge in at that time   Consulted and Agree with Plan of Care Patient        Problem List Patient Active Problem List   Diagnosis Date Noted  . Hypercholesterolemia 12/10/2012  . Weight gain 12/10/2012  . Vaginal atrophy 12/10/2012  . Osteopenia 12/10/2012    Zacari Stiff PT, OCS 10/16/2014, 10:10 AM  [facility] [address]  [place], [place], [address] Phone: [phone]   Fax:  [phone]  PHYSICAL THERAPY DISCHARGE SUMMARY  Visits from Start of Care: 28  Current functional level related to goals / functional outcomes: All goals met   Remaining deficits: Intermittent mild pain   Education / Equipment: HEP Plan: Patient agrees to discharge.  Patient goals were met. Patient is being discharged due to meeting the stated rehab goals.  ?????       Leonette Most PT, OCS 11/27/2014 2:28 PM

## 2014-12-22 ENCOUNTER — Ambulatory Visit (INDEPENDENT_AMBULATORY_CARE_PROVIDER_SITE_OTHER): Payer: BLUE CROSS/BLUE SHIELD | Admitting: Gynecology

## 2014-12-22 ENCOUNTER — Other Ambulatory Visit (HOSPITAL_COMMUNITY)
Admission: RE | Admit: 2014-12-22 | Discharge: 2014-12-22 | Disposition: A | Discharge status: Home / Self Care | Payer: BLUE CROSS/BLUE SHIELD | Source: Ambulatory Visit | Attending: Gynecology | Admitting: Gynecology

## 2014-12-22 ENCOUNTER — Encounter: Payer: Self-pay | Admitting: Gynecology

## 2014-12-22 VITALS — BP 114/70 | Ht 65.5 in | Wt 147.0 lb

## 2014-12-22 DIAGNOSIS — M8580 Other specified disorders of bone density and structure, unspecified site: Secondary | ICD-10-CM | POA: Diagnosis not present

## 2014-12-22 DIAGNOSIS — Z01419 Encounter for gynecological examination (general) (routine) without abnormal findings: Secondary | ICD-10-CM | POA: Diagnosis not present

## 2014-12-22 DIAGNOSIS — E785 Hyperlipidemia, unspecified: Secondary | ICD-10-CM

## 2014-12-22 LAB — THYROID PANEL WITH TSH
FREE THYROXINE INDEX: 2.2 (ref 1.4–3.8)
T3 Uptake: 30 % (ref 22–35)
T4, Total: 7.4 ug/dL (ref 4.5–12.0)
TSH: 1.73 u[IU]/mL (ref 0.350–4.500)

## 2014-12-22 LAB — CBC WITH DIFFERENTIAL/PLATELET
Basophils Absolute: 0 10*3/uL (ref 0.0–0.1)
Basophils Relative: 0 % (ref 0–1)
EOS ABS: 0.1 10*3/uL (ref 0.0–0.7)
Eosinophils Relative: 1 % (ref 0–5)
HCT: 43.2 % (ref 36.0–46.0)
Hemoglobin: 14.6 g/dL (ref 12.0–15.0)
LYMPHS ABS: 1.2 10*3/uL (ref 0.7–4.0)
Lymphocytes Relative: 21 % (ref 12–46)
MCH: 33.1 pg (ref 26.0–34.0)
MCHC: 33.8 g/dL (ref 30.0–36.0)
MCV: 98 fL (ref 78.0–100.0)
MPV: 11.4 fL (ref 8.6–12.4)
Monocytes Absolute: 0.4 10*3/uL (ref 0.1–1.0)
Monocytes Relative: 7 % (ref 3–12)
NEUTROS PCT: 71 % (ref 43–77)
Neutro Abs: 4.2 10*3/uL (ref 1.7–7.7)
PLATELETS: 221 10*3/uL (ref 150–400)
RBC: 4.41 MIL/uL (ref 3.87–5.11)
RDW: 14 % (ref 11.5–15.5)
WBC: 5.9 10*3/uL (ref 4.0–10.5)

## 2014-12-22 LAB — LIPID PANEL
CHOLESTEROL: 223 mg/dL — AB (ref 125–200)
HDL: 98 mg/dL (ref >=46)
LDL Cholesterol: 113 mg/dL (ref <130)
Total CHOL/HDL Ratio: 2.3 Ratio (ref <=5.0)
Triglycerides: 62 mg/dL (ref <150)
VLDL: 12 mg/dL (ref <30)

## 2014-12-22 LAB — COMPREHENSIVE METABOLIC PANEL
ALT: 36 U/L — ABNORMAL HIGH (ref 6–29)
AST: 22 U/L (ref 10–35)
Albumin: 5 g/dL (ref 3.6–5.1)
Alkaline Phosphatase: 79 U/L (ref 33–130)
BUN: 19 mg/dL (ref 7–25)
CHLORIDE: 103 mmol/L (ref 98–110)
CO2: 26 mmol/L (ref 20–31)
CREATININE: 0.83 mg/dL (ref 0.50–0.99)
Calcium: 9.8 mg/dL (ref 8.6–10.4)
GLUCOSE: 91 mg/dL (ref 65–99)
POTASSIUM: 5 mmol/L (ref 3.5–5.3)
Sodium: 141 mmol/L (ref 135–146)
TOTAL PROTEIN: 7.2 g/dL (ref 6.1–8.1)
Total Bilirubin: 0.6 mg/dL (ref 0.2–1.2)

## 2014-12-22 LAB — CYTOLOGY - PAP

## 2014-12-22 MED ORDER — SIMVASTATIN 10 MG PO TABS: 10.0000 mg | ORAL_TABLET | Freq: Every day | ORAL | Status: DC

## 2014-12-22 NOTE — Patient Instructions (Signed)

## 2014-12-22 NOTE — Progress Notes (Signed)
Karla Lee 1950-08-07 [id number]   History:    64 y.o.  for annual gyn exam with no complaints today. Patient was a former patient of Dr. Nicholas Lose. We had reviewed his past records which indicated that she is a gravida 4, para 2, AB 2. Patient with past history of supracervical hysterectomy for abnormal uterine bleeding unresponsive to conservative therapy. 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 Patient with history of hypercholesterolemia on simvastatin 10 mg daily. Patient's Last bone density study was here in our office in 2014 demonstrated her lowest T. score was -2.0 at the AP spine and -2.0 at the right femoral neck and normal FRAX analysis. 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 Patient denies any recent outbreak.patient is being followed by an allergist for her atopic dermatitis. Patient is no longer on vaginal S region. She is sexually active. She has not had her shingles vaccine as of yet.  Past medical history,surgical history, family history and social history were all reviewed and documented in the EPIC chart.  Gynecologic History No LMP recorded. Patient has had a hysterectomy. Contraception: status post hysterectomy Last Pap: 2015. Results were: normal Last mammogram: 2016. Results were: Normal but dense  Obstetric History OB History  Gravida Para Term Preterm AB SAB TAB Ectopic Multiple Living  4 2   2 2    2     # Outcome Date GA Lbr Len/2nd Weight Sex Delivery Anes PTL Lv  4 SAB           3 SAB           2 Para           1 Para                ROS: A ROS was performed and pertinent positives and negatives are included in the history.  GENERAL: No fevers or chills. HEENT: No change in vision, no earache, sore throat or sinus congestion. NECK: No pain or stiffness. CARDIOVASCULAR: No chest pain or pressure.  No palpitations. PULMONARY: No shortness of breath, cough or wheeze. GASTROINTESTINAL: No abdominal pain, nausea, vomiting or diarrhea, melena or bright red blood per rectum. GENITOURINARY: No urinary frequency, urgency, hesitancy or dysuria. MUSCULOSKELETAL: No joint or muscle pain, no back pain, no recent trauma. DERMATOLOGIC: No rash, no itching, no lesions. ENDOCRINE: No polyuria, polydipsia, no heat or cold intolerance. No recent change in weight. HEMATOLOGICAL: No anemia or easy bruising or bleeding. NEUROLOGIC: No headache, seizures, numbness, tingling or weakness. PSYCHIATRIC: No depression, no loss of interest in normal activity or change in sleep pattern.     Exam: chaperone present  BP 114/70 mmHg  Ht 5' 5.5" (1.664 m)  Wt 147 lb (66.679 kg)  BMI 24.08 kg/m2  Body mass index is 24.08 kg/(m^2).  General appearance : Well developed well nourished female. No acute distress HEENT: Eyes: no retinal hemorrhage or exudates,  Neck supple, trachea midline, no carotid bruits, no thyroidmegaly Lungs: Clear to auscultation, no rhonchi or wheezes, or rib retractions  Heart: Regular rate and rhythm, no murmurs or gallops Breast:Examined in sitting and supine position were symmetrical in appearance, no palpable masses or tenderness,  no skin retraction, no nipple inversion, no nipple discharge, no skin discoloration, no axillary or supraclavicular lymphadenopathy Abdomen: no palpable masses or tenderness, no rebound or guarding Extremities: no edema or  skin discoloration or tenderness  Pelvic:  Bartholin, Urethra, Skene Glands: Within normal limits             Vagina: No gross lesions or discharge  Cervix: No gross lesions or discharge  Uterus  absent  Adnexa  Without masses or tenderness  Anus and perineum  normal   Rectovaginal  normal sphincter tone without palpated masses or tenderness             Hemoccult cards provided     Assessment/Plan:  64 y.o. female for annual exam with  history of hyperlipidemia since she is fasting we'll do a fasting lipid profile along with the following screening blood work: Comprehensive metabolic panel, TSH, CBC, and urinalysis. Because of dysplasia history her Pap smear will be done today. She is due for her bone density study in September. Because of her history of osteopenia a vitamin D level will be checked today as well. She was reminded of the importance of calcium vitamin D and regular exercise for osteoporosis prevention. She was provided wit  Hemoccult card to submit to the office for testing. Prescription refill for her simvastatin 10 mg daily for hyperlipidemia was provided.  Ok Edwards MD, 9:11 AM 12/22/2014

## 2014-12-23 ENCOUNTER — Other Ambulatory Visit: Payer: Self-pay | Admitting: Gynecology

## 2014-12-23 DIAGNOSIS — R945 Abnormal results of liver function studies: Principal | ICD-10-CM

## 2014-12-23 DIAGNOSIS — R7989 Other specified abnormal findings of blood chemistry: Secondary | ICD-10-CM

## 2014-12-23 LAB — URINALYSIS W MICROSCOPIC + REFLEX CULTURE
BILIRUBIN URINE: NEGATIVE
CASTS: NONE SEEN
Crystals: NONE SEEN
GLUCOSE, UA: NEGATIVE mg/dL
Hgb urine dipstick: NEGATIVE
KETONES UR: NEGATIVE mg/dL
Nitrite: NEGATIVE
Protein, ur: NEGATIVE mg/dL
SPECIFIC GRAVITY, URINE: 1.016 (ref 1.005–1.030)
Urobilinogen, UA: 0.2 mg/dL (ref 0.0–1.0)
pH: 6.5 (ref 5.0–8.0)

## 2014-12-23 LAB — VITAMIN D 25 HYDROXY (VIT D DEFICIENCY, FRACTURES): VIT D 25 HYDROXY: 40 ng/mL (ref 30–100)

## 2014-12-24 LAB — URINE CULTURE
COLONY COUNT: NO GROWTH
ORGANISM ID, BACTERIA: NO GROWTH

## 2015-02-05 ENCOUNTER — Ambulatory Visit (INDEPENDENT_AMBULATORY_CARE_PROVIDER_SITE_OTHER): Payer: BLUE CROSS/BLUE SHIELD

## 2015-02-05 ENCOUNTER — Other Ambulatory Visit: Payer: Self-pay | Admitting: Gynecology

## 2015-02-05 DIAGNOSIS — M8580 Other specified disorders of bone density and structure, unspecified site: Secondary | ICD-10-CM

## 2015-02-05 DIAGNOSIS — M899 Disorder of bone, unspecified: Secondary | ICD-10-CM | POA: Diagnosis not present

## 2015-02-05 DIAGNOSIS — Z13820 Encounter for screening for osteoporosis: Secondary | ICD-10-CM

## 2015-07-24 ENCOUNTER — Other Ambulatory Visit: Payer: Self-pay

## 2015-07-24 DIAGNOSIS — Z1231 Encounter for screening mammogram for malignant neoplasm of breast: Secondary | ICD-10-CM

## 2015-08-05 ENCOUNTER — Other Ambulatory Visit: Payer: BLUE CROSS/BLUE SHIELD

## 2015-08-05 DIAGNOSIS — R7989 Other specified abnormal findings of blood chemistry: Secondary | ICD-10-CM

## 2015-08-05 DIAGNOSIS — R945 Abnormal results of liver function studies: Principal | ICD-10-CM

## 2015-08-05 LAB — ALT: ALT: 15 U/L (ref 6–29)

## 2015-08-05 LAB — AST: AST: 18 U/L (ref 10–35)

## 2015-08-14 ENCOUNTER — Ambulatory Visit
Admission: RE | Admit: 2015-08-14 | Discharge: 2015-08-14 | Disposition: A | Discharge status: Home / Self Care | Payer: BLUE CROSS/BLUE SHIELD | Source: Ambulatory Visit

## 2015-08-14 DIAGNOSIS — Z1231 Encounter for screening mammogram for malignant neoplasm of breast: Secondary | ICD-10-CM

## 2015-10-16 ENCOUNTER — Other Ambulatory Visit: Payer: Self-pay | Admitting: Gastroenterology

## 2015-10-19 ENCOUNTER — Other Ambulatory Visit: Payer: Self-pay | Admitting: Gastroenterology

## 2015-10-19 DIAGNOSIS — R1900 Intra-abdominal and pelvic swelling, mass and lump, unspecified site: Secondary | ICD-10-CM

## 2015-10-22 ENCOUNTER — Ambulatory Visit
Admission: RE | Admit: 2015-10-22 | Discharge: 2015-10-22 | Disposition: A | Discharge status: Home / Self Care | Payer: BLUE CROSS/BLUE SHIELD | Source: Ambulatory Visit | Attending: Gastroenterology | Admitting: Gastroenterology

## 2015-10-22 DIAGNOSIS — R1900 Intra-abdominal and pelvic swelling, mass and lump, unspecified site: Secondary | ICD-10-CM

## 2015-10-22 MED ORDER — IOPAMIDOL (ISOVUE-300) INJECTION 61%
100.0000 mL | Freq: Once | INTRAVENOUS | Status: AC | PRN
  Administered 2015-10-22: 100 mL via INTRAVENOUS

## 2015-12-24 ENCOUNTER — Ambulatory Visit (INDEPENDENT_AMBULATORY_CARE_PROVIDER_SITE_OTHER): Payer: BLUE CROSS/BLUE SHIELD | Admitting: Gynecology

## 2015-12-24 ENCOUNTER — Encounter: Payer: Self-pay | Admitting: Gynecology

## 2015-12-24 VITALS — BP 120/70 | Ht 65.5 in | Wt 147.0 lb

## 2015-12-24 DIAGNOSIS — Z01419 Encounter for gynecological examination (general) (routine) without abnormal findings: Secondary | ICD-10-CM | POA: Diagnosis not present

## 2015-12-24 DIAGNOSIS — M8580 Other specified disorders of bone density and structure, unspecified site: Secondary | ICD-10-CM | POA: Diagnosis not present

## 2015-12-24 DIAGNOSIS — Z780 Asymptomatic menopausal state: Secondary | ICD-10-CM

## 2015-12-24 DIAGNOSIS — E7800 Pure hypercholesterolemia, unspecified: Secondary | ICD-10-CM | POA: Diagnosis not present

## 2015-12-24 LAB — LIPID PANEL
Cholesterol: 173 mg/dL (ref 125–200)
HDL: 73 mg/dL (ref >=46)
LDL CALC: 88 mg/dL (ref <130)
TRIGLYCERIDES: 61 mg/dL (ref <150)
Total CHOL/HDL Ratio: 2.4 Ratio (ref <=5.0)
VLDL: 12 mg/dL (ref <30)

## 2015-12-24 LAB — CBC WITH DIFFERENTIAL/PLATELET
BASOS ABS: 0 {cells}/uL (ref 0–200)
BASOS PCT: 0 %
EOS ABS: 96 {cells}/uL (ref 15–500)
EOS PCT: 2 %
HCT: 42.2 % (ref 35.0–45.0)
HEMOGLOBIN: 13.8 g/dL (ref 11.7–15.5)
LYMPHS ABS: 1296 {cells}/uL (ref 850–3900)
Lymphocytes Relative: 27 %
MCH: 32.8 pg (ref 27.0–33.0)
MCHC: 32.7 g/dL (ref 32.0–36.0)
MCV: 100.2 fL — ABNORMAL HIGH (ref 80.0–100.0)
MPV: 12 fL (ref 7.5–12.5)
Monocytes Absolute: 288 cells/uL (ref 200–950)
Monocytes Relative: 6 %
NEUTROS ABS: 3120 {cells}/uL (ref 1500–7800)
Neutrophils Relative %: 65 %
PLATELETS: 198 10*3/uL (ref 140–400)
RBC: 4.21 MIL/uL (ref 3.80–5.10)
RDW: 13.4 % (ref 11.0–15.0)
WBC: 4.8 10*3/uL (ref 3.8–10.8)

## 2015-12-24 LAB — URINALYSIS W MICROSCOPIC + REFLEX CULTURE
BILIRUBIN URINE: NEGATIVE
Bacteria, UA: NONE SEEN [HPF]
Casts: NONE SEEN [LPF]
Crystals: NONE SEEN [HPF]
GLUCOSE, UA: NEGATIVE
Hgb urine dipstick: NEGATIVE
KETONES UR: NEGATIVE
LEUKOCYTES UA: NEGATIVE
NITRITE: NEGATIVE
PROTEIN: NEGATIVE
RBC / HPF: NONE SEEN RBC/HPF (ref <=2)
SPECIFIC GRAVITY, URINE: 1.014 (ref 1.001–1.035)
WBC UA: NONE SEEN WBC/HPF (ref <=5)
Yeast: NONE SEEN [HPF]
pH: 7.5 (ref 5.0–8.0)

## 2015-12-24 LAB — COMPREHENSIVE METABOLIC PANEL
ALT: 19 U/L (ref 6–29)
AST: 21 U/L (ref 10–35)
Albumin: 4.3 g/dL (ref 3.6–5.1)
Alkaline Phosphatase: 66 U/L (ref 33–130)
BUN: 18 mg/dL (ref 7–25)
CO2: 26 mmol/L (ref 20–31)
Calcium: 9.1 mg/dL (ref 8.6–10.4)
Chloride: 103 mmol/L (ref 98–110)
Creat: 0.88 mg/dL (ref 0.50–0.99)
Glucose, Bld: 93 mg/dL (ref 65–99)
POTASSIUM: 4.8 mmol/L (ref 3.5–5.3)
Sodium: 138 mmol/L (ref 135–146)
TOTAL PROTEIN: 6.6 g/dL (ref 6.1–8.1)
Total Bilirubin: 0.5 mg/dL (ref 0.2–1.2)

## 2015-12-24 LAB — TSH: TSH: 1.82 m[IU]/L

## 2015-12-24 MED ORDER — SIMVASTATIN 10 MG PO TABS: 10.0000 mg | ORAL_TABLET | Freq: Every day | ORAL | 3 refills | Status: DC

## 2015-12-24 NOTE — Progress Notes (Signed)
Karla Lee Jan 26, 1951 [id number]   History:    65 y.o.  for annual gyn exam with no complaints today. Patient was seen last year for the first time as a new patient. Patient was a former patient of Dr. Nicholas Lose. We had reviewed his past records which indicated that she is a gravida 4, para 2, AB 2. Patient with past history of supracervical hysterectomy for abnormal uterine bleeding unresponsive to conservative therapy. 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 Patient with history of hypercholesterolemia on simvastatin 10 mg daily. Patient's Last bone density study was here in our office in 2016 demonstrated the lowest T score was at the AP spine with a value of -2.2 in the right femoral neck -2.1 and normal Frax analysis. We compare with previous study 2 years prior that was stability in bone mineralization. Patient is on no hormone replacement therapy  Past medical history,surgical history, family history and social history were all reviewed and documented in the EPIC chart.  Gynecologic History No LMP recorded. Patient has had a hysterectomy. Contraception: post menopausal status Last Pap: 2016. Results were: normal Last mammogram: 2017. Results were: normal  Obstetric History OB History  Gravida Para Term Preterm AB Living  SAB TAB Ectopic Multiple Live Births  2            # Outcome Date GA Lbr Len/2nd Weight Sex Delivery Anes PTL Lv  4 SAB           3 SAB           2 Para           1 Para                ROS: A ROS was performed and pertinent positives and negatives are included in the history.  GENERAL: No fevers or chills. HEENT: No change in vision, no earache, sore throat or sinus congestion. NECK: No pain or stiffness. CARDIOVASCULAR: No chest pain or pressure. No palpitations. PULMONARY: No shortness of breath, cough or wheeze. GASTROINTESTINAL: No abdominal  pain, nausea, vomiting or diarrhea, melena or bright red blood per rectum. GENITOURINARY: No urinary frequency, urgency, hesitancy or dysuria. MUSCULOSKELETAL: No joint or muscle pain, no back pain, no recent trauma. DERMATOLOGIC: No rash, no itching, no lesions. ENDOCRINE: No polyuria, polydipsia, no heat or cold intolerance. No recent change in weight. HEMATOLOGICAL: No anemia or easy bruising or bleeding. NEUROLOGIC: No headache, seizures, numbness, tingling or weakness. PSYCHIATRIC: No depression, no loss of interest in normal activity or change in sleep pattern.     Exam: chaperone present  BP 120/70   Ht 5' 5.5" (1.664 m)   Wt 147 lb (66.7 kg)   BMI 24.09 kg/m   Body mass index is 24.09 kg/m.  General appearance : Well developed well nourished female. No acute distress HEENT: Eyes: no retinal hemorrhage or exudates,  Neck supple, trachea midline, no carotid bruits, no thyroidmegaly Lungs: Clear to auscultation, no rhonchi or wheezes, or rib retractions  Heart: Regular rate and rhythm, no murmurs or gallops Breast:Examined in sitting and supine position were symmetrical in appearance, no palpable masses or tenderness,  no skin retraction, no nipple inversion, no nipple discharge, no skin discoloration, no axillary or supraclavicular lymphadenopathy Abdomen: no palpable masses or tenderness, no rebound or guarding Extremities: no edema or skin discoloration or tenderness  Pelvic:  Bartholin, Urethra, Skene Glands: Within normal limits             Vagina: No gross lesions or discharge  Cervix: No gross lesions or discharge  Uterus  absent  Adnexa  Without masses or tenderness  Anus and perineum  normal   Rectovaginal  normal sphincter tone without palpated masses or tenderness             Hemoccult card provided     Assessment/Plan:  65 y.o. female for annual exam will have the following screening blood work drawn today: Comprehensive metabolic panel, fasting lipid profile, TSH,  CBC, and urinalysis. Prescription refill for Zocor was provided. We discussed importance of calcium vitamin D and weightbearing exercises for osteoporosis prevention. We also discussed importance of monthly breast exams. Pap smear without HPV done today. She was provided with fecal Hemoccult card system it to the office for testing.   Ok Edwards MD, 8:44 AM 12/24/2015

## 2015-12-24 NOTE — Addendum Note (Signed)
Addended by: Berna Spare A on: 12/24/2015 08:51 AM   Modules accepted: Orders

## 2015-12-25 LAB — PAP IG W/ RFLX HPV ASCU

## 2015-12-25 LAB — VITAMIN D 25 HYDROXY (VIT D DEFICIENCY, FRACTURES): Vit D, 25-Hydroxy: 44 ng/mL (ref 30–100)

## 2015-12-31 ENCOUNTER — Encounter: Payer: Self-pay | Admitting: Gynecology

## 2016-01-04 ENCOUNTER — Other Ambulatory Visit: Payer: Self-pay | Admitting: Gynecology

## 2016-01-07 ENCOUNTER — Other Ambulatory Visit: Payer: Self-pay

## 2016-01-07 ENCOUNTER — Other Ambulatory Visit: Payer: Self-pay | Admitting: Gynecology

## 2016-01-07 MED ORDER — SIMVASTATIN 10 MG PO TABS: 10.0000 mg | ORAL_TABLET | Freq: Every day | ORAL | 3 refills | Status: DC

## 2016-03-09 ENCOUNTER — Encounter: Payer: Self-pay | Admitting: Gynecology

## 2016-03-09 ENCOUNTER — Other Ambulatory Visit: Payer: Self-pay

## 2016-03-09 MED ORDER — SIMVASTATIN 10 MG PO TABS: 10.0000 mg | ORAL_TABLET | Freq: Every day | ORAL | 2 refills | Status: DC

## 2016-03-09 NOTE — Telephone Encounter (Signed)
Patient now has new mail order pharmacy. Rx sent as requested.

## 2016-05-26 ENCOUNTER — Telehealth: Payer: Self-pay

## 2016-05-26 NOTE — Telephone Encounter (Signed)
I called patient because Dr. Glenetta HewJF [facility] with concern about patient being prescribed Clarithromycin by Dr Kathrynn RunningManning and it reacting negatively with her Simvastain causing rhabdomyolysis.  Patient said it was a short prescription of Clarithromycin and she has already completed if and was advised not to take the Simvastatin while taking it.  I faxed the form back to the pharmacy with this info on it.

## 2016-07-22 ENCOUNTER — Other Ambulatory Visit: Payer: Self-pay | Admitting: Gynecology

## 2016-07-22 DIAGNOSIS — Z1231 Encounter for screening mammogram for malignant neoplasm of breast: Secondary | ICD-10-CM

## 2016-08-15 ENCOUNTER — Ambulatory Visit
Admission: RE | Admit: 2016-08-15 | Discharge: 2016-08-15 | Disposition: A | Discharge status: Home / Self Care | Payer: Medicare Other | Source: Ambulatory Visit | Attending: Gynecology | Admitting: Gynecology

## 2016-08-15 DIAGNOSIS — Z1231 Encounter for screening mammogram for malignant neoplasm of breast: Secondary | ICD-10-CM

## 2016-10-12 ENCOUNTER — Encounter: Payer: Self-pay | Admitting: Gynecology

## 2016-10-15 ENCOUNTER — Other Ambulatory Visit: Payer: Self-pay | Admitting: Gynecology

## 2016-10-17 NOTE — Telephone Encounter (Signed)
Per note on 12/24/15 "Prescription refill for Zocor was provided."

## 2016-12-12 ENCOUNTER — Ambulatory Visit (INDEPENDENT_AMBULATORY_CARE_PROVIDER_SITE_OTHER): Payer: Medicare Other

## 2016-12-12 ENCOUNTER — Ambulatory Visit (INDEPENDENT_AMBULATORY_CARE_PROVIDER_SITE_OTHER): Payer: Medicare Other | Admitting: Physician Assistant

## 2016-12-12 DIAGNOSIS — M25562 Pain in left knee: Secondary | ICD-10-CM | POA: Diagnosis not present

## 2016-12-12 MED ORDER — LIDOCAINE HCL 1 % IJ SOLN
3.0000 mL | INTRAMUSCULAR | Status: AC | PRN
  Administered 2016-12-12: 3 mL

## 2016-12-12 MED ORDER — METHYLPREDNISOLONE ACETATE 40 MG/ML IJ SUSP
40.0000 mg | INTRAMUSCULAR | Status: AC | PRN
  Administered 2016-12-12: 40 mg via INTRA_ARTICULAR

## 2016-12-12 NOTE — Progress Notes (Signed)
Office Visit Note   Patient: Karla Lee           Date of Birth: July 15, 1950           MRN: [id number] Visit Date: 12/12/2016              Requested by: No referring provider defined for this encounter. PCP: Arlan OrganManning, James S, MD   Assessment & Plan: Visit Diagnoses:  1. Acute pain of left knee     Plan: She'll work on Dance movement psychotherapistquad strengthening. Knee friendly exercises were discussed with her at length. She'll follow with us 2 weeks check her progress lack of. If she is doing well she can call and cancel the appointment. Questions were encouraged and answered.  Follow-Up Instructions: Return in about 2 weeks (around 12/26/2016).   Orders:  Orders Placed This Encounter  Procedures  . Large Joint Injection/Arthrocentesis  . XR KNEE 3 VIEW LEFT   No orders of the defined types were placed in this encounter.     Procedures: Large Joint Inj Date/Time: 12/12/2016 11:41 AM Performed by: Kirtland BouchardLARK, GILBERT W Authorized by: Kirtland BouchardLARK, GILBERT W   Consent Given by:  Patient Indications:  Pain Location:  Knee Site:  L knee Needle Size:  22 G Approach:  Anterolateral Ultrasound Guidance: No   Fluoroscopic Guidance: No   Medications:  40 mg methylPREDNISolone acetate 40 MG/ML; 3 mL lidocaine 1 % Aspiration Attempted: No   Patient tolerance:  Patient tolerated the procedure well with no immediate complications     Clinical Data: No additional findings.   Subjective: Chief Complaint  Patient presents with  . Left Knee - Pain    HPI Karla Lee  Comes in today with acute left knee pain. She denies any particular injury to the knee. States she's had 2 episodes of knee locking up one episode 6 months ago and then the most recent 4 weeks ago. Outside of these locking sensations but she's had no mechanical symptoms of the knee. She notes that she mostly has to stiffness in the knee. She is unable take NSAIDs. We've seen her in 2 years ago last after a right shoulder arthroscopy  with subacromial decompression. Right shoulder still doing well. Review of Systems  Negative outside of the history of present illness please see history of present illness Objective: Vital Signs: There were no vitals taken for this visit.  Physical Exam  Constitutional: She is oriented to person, place, and time. She appears well-developed and well-nourished. No distress.  Pulmonary/Chest: Effort normal.  Neurological: She is alert and oriented to person, place, and time.  Skin: Skin is warm and dry. She is not diaphoretic.  Psychiatric: She has a normal mood and affect. Her behavior is normal.    Ortho Exam Left knee she has full extension and full flexion. Tenderness along the medial collateral ligament from the origin to the insertion. No instability valgus varus stressing of either knee. Negative McMurray's bilaterally. Karla Lee is negative bilaterally. Anterior drawer is negative bilaterally. No effusion abnormal warmth erythema or edema of either knee. Specialty Comments:  No specialty comments available.  Imaging: Xr Knee 3 View Left  Result Date: 12/12/2016 AP lateral and sunrise view left knee: No acute fractures. All 3 compartments look well preserved. On the sunrise view the patella is bilaterally track laterally.    PMFS History: Patient Active Problem List   Diagnosis Date Noted  . Hypercholesterolemia 12/10/2012  . Vaginal atrophy 12/10/2012  . Osteopenia 12/10/2012   Past Medical  History:  Diagnosis Date  . High cholesterol     Family History  Problem Relation Age of Onset  . Heart disease Father     Past Surgical History:  Procedure Laterality Date  . ABDOMINAL HYSTERECTOMY  1999   SUPRACERVICAL HYSTERECTOMY   . CERVICAL BIOPSY  W/ LOOP ELECTRODE EXCISION     2003  . SHOULDER SURGERY  04/2014   arhroscopic   Social History   Occupational History  . Not on file.   Social History Main Topics  . Smoking status: Never Smoker  . Smokeless  tobacco: Never Used  . Alcohol use Yes     Comment: WINE  . Drug use: Unknown  . Sexual activity: Yes    Birth control/ protection: Post-menopausal

## 2016-12-13 ENCOUNTER — Telehealth (INDEPENDENT_AMBULATORY_CARE_PROVIDER_SITE_OTHER): Payer: Self-pay | Admitting: Radiology

## 2016-12-13 NOTE — Telephone Encounter (Signed)
Please advise. Thanks.  

## 2016-12-13 NOTE — Telephone Encounter (Signed)
Normal to have pain day after injection. Ice and rest

## 2016-12-13 NOTE — Telephone Encounter (Signed)
Called s/w patient and advised.  

## 2016-12-13 NOTE — Telephone Encounter (Signed)
Patient lmom that she saw Bronson CurbGil 12/12/16, and he did a knee injection and now she is having swelling and pain that was not there before.  Please call and advise her.

## 2016-12-26 ENCOUNTER — Ambulatory Visit (INDEPENDENT_AMBULATORY_CARE_PROVIDER_SITE_OTHER): Payer: Medicare Other | Admitting: Physician Assistant

## 2016-12-26 ENCOUNTER — Encounter (INDEPENDENT_AMBULATORY_CARE_PROVIDER_SITE_OTHER): Payer: Self-pay | Admitting: Physician Assistant

## 2016-12-26 VITALS — Ht 65.5 in | Wt 142.0 lb

## 2016-12-26 DIAGNOSIS — M25562 Pain in left knee: Secondary | ICD-10-CM

## 2016-12-26 NOTE — Progress Notes (Signed)
Mrs. Rosalia Hammerstaszynski returns today for follow-up of her left knee status post injection with cortisone 12/12/2016. She states she still has no real pain in the knee to stiffness. She feels she had no real relief from the injection. She's having no mechanical symptoms of the knee. She also notes that the knee "feels swollen but it is not swollen".  Review of systems: Please see history of present illness otherwise negative  Physical exam: Left knee good range of motion of the knee without pain. No instability valgus varus stressing. No tenderness along medial lateral joint line. McMurray's is negative. No effusion abnormal warmth or erythema knee compared to the right.  Plan: At this point I will have her continue work on range of motion strengthening exercises particularly quad strengthening exercises as discussed in the past. If she develops any mechanical symptoms of the knee we can certainly obtain an MRI to rule out meniscal tear. I did explain to her today she is having no mechanical symptoms has no tenderness and no effusion of the knee and therefore do not feel that at this point in time her knee stiffness warrants a MRI. Follow up on an as-needed basis

## 2016-12-28 ENCOUNTER — Encounter: Payer: Self-pay | Admitting: Gynecology

## 2016-12-28 ENCOUNTER — Ambulatory Visit (INDEPENDENT_AMBULATORY_CARE_PROVIDER_SITE_OTHER): Payer: Medicare Other | Admitting: Gynecology

## 2016-12-28 VITALS — BP 120/76 | Ht 65.25 in | Wt 148.6 lb

## 2016-12-28 DIAGNOSIS — Z01419 Encounter for gynecological examination (general) (routine) without abnormal findings: Secondary | ICD-10-CM

## 2016-12-28 DIAGNOSIS — M8580 Other specified disorders of bone density and structure, unspecified site: Secondary | ICD-10-CM

## 2016-12-28 DIAGNOSIS — Z87410 Personal history of cervical dysplasia: Secondary | ICD-10-CM | POA: Insufficient documentation

## 2016-12-28 NOTE — Patient Instructions (Signed)
Bone Densitometry Bone densitometry is an imaging test that uses a special X-ray to measure the amount of calcium and other minerals in your bones (bone density). This test is also known as a bone mineral density test or dual-energy X-ray absorptiometry (DXA). The test can measure bone density at your hip and your spine. It is similar to having a regular X-ray. You may have this test to:  Diagnose a condition that causes weak or thin bones (osteoporosis).  Predict your risk of a broken bone (fracture).  Determine how well osteoporosis treatment is working.  Tell a health care provider about:  Any allergies you have.  All medicines you are taking, including vitamins, herbs, eye drops, creams, and over-the-counter medicines.  Any problems you or family members have had with anesthetic medicines.  Any blood disorders you have.  Any surgeries you have had.  Any medical conditions you have.  Possibility of pregnancy.  Any other medical test you had within the previous 14 days that used contrast material. What are the risks? Generally, this is a safe procedure. However, problems can occur and may include the following:  This test exposes you to a very small amount of radiation.  The risks of radiation exposure may be greater to unborn children.  What happens before the procedure?  Do not take any calcium supplements for 24 hours before having the test. You can otherwise eat and drink what you usually do.  Take off all metal jewelry, eyeglasses, dental appliances, and any other metal objects. What happens during the procedure?  You may lie on an exam table. There will be an X-ray generator below you and an imaging device above you.  Other devices, such as boxes or braces, may be used to position your body properly for the scan.  You will need to lie still while the machine slowly scans your body.  The images will show up on a computer monitor. What happens after the  procedure? You may need more testing at a later time. This information is not intended to replace advice given to you by your health care provider. Make sure you discuss any questions you have with your health care provider. Document Released: 06/07/2004 Document Revised: 10/22/2015 Document Reviewed: 10/24/2013 Elsevier Interactive Patient Education  2018 [employer]   

## 2016-12-28 NOTE — Progress Notes (Signed)
Karla Lee 1950/06/18 [id number]   History:    66 y.o.  for annual gyn exam with no complaints today. Patient became 8 new patient to her practice in 2016. Patient was a former patient of Dr. Nicholas Lee. We had reviewed his past records which indicated that she is a gravida 4, para 2, AB 2. Patient with past history of supracervical hysterectomy for abnormal uterine bleeding unresponsive to conservative therapy. 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 Patient with history of hypercholesterolemia on simvastatin 10 mg daily. Patient's Last bone density study was here in our office in 2016 demonstrated the lowest T score was at the AP spine with a value of -2.2 in the right femoral neck -2.1 and normal Frax analysis. We compare with previous study 2 years prior that was stability in bone mineralization. Patient is on no hormone replacement therapy. Normal colonoscopy 2009.  Past medical history,surgical history, family history and social history were all reviewed and documented in the EPIC chart.  Gynecologic History No LMP recorded. Patient has had a hysterectomy. Contraception: post menopausal status Last Pap:  2017. Results were: normal Last mammogram:  2018. Results were: normal  Obstetric History OB History  Gravida Para Term Preterm AB Living  4 2     2 2   SAB TAB Ectopic Multiple Live Births  2            # Outcome Date GA Lbr Len/2nd Weight Sex Delivery Anes PTL Lv  4 SAB           3 SAB           2 Para           1 Para                ROS: A ROS was performed and pertinent positives and negatives are included in the history.  GENERAL: No fevers or chills. HEENT: No change in vision, no earache, sore throat or sinus congestion. NECK: No pain or stiffness. CARDIOVASCULAR: No chest pain or pressure. No palpitations. PULMONARY: No shortness of breath, cough or wheeze.  GASTROINTESTINAL: No abdominal pain, nausea, vomiting or diarrhea, melena or bright red blood per rectum. GENITOURINARY: No urinary frequency, urgency, hesitancy or dysuria. MUSCULOSKELETAL: No joint or muscle pain, no back pain, no recent trauma. DERMATOLOGIC: No rash, no itching, no lesions. ENDOCRINE: No polyuria, polydipsia, no heat or cold intolerance. No recent change in weight. HEMATOLOGICAL: No anemia or easy bruising or bleeding. NEUROLOGIC: No headache, seizures, numbness, tingling or weakness. PSYCHIATRIC: No depression, no loss of interest in normal activity or change in sleep pattern.     Exam: chaperone present  BP 120/76   Ht 5' 5.25" (1.657 m)   Wt 148 lb 9.6 oz (67.4 kg)   BMI 24.54 kg/m   Body mass index is 24.54 kg/m.  General appearance : Well developed well nourished female. No acute distress HEENT: Eyes: no retinal hemorrhage or exudates,  Neck supple, trachea midline, no carotid bruits, no thyroidmegaly Lungs: Clear to auscultation, no rhonchi or wheezes, or rib retractions  Heart: Regular rate and rhythm, no murmurs or gallops Breast:Examined in sitting and supine position were symmetrical in appearance, no palpable masses or tenderness,  no skin retraction, no nipple inversion, no nipple discharge, no skin discoloration, no axillary or supraclavicular lymphadenopathy Abdomen: no palpable masses or tenderness, no rebound or guarding Extremities: no edema or skin discoloration  or tenderness  Pelvic:  Bartholin, Urethra, Skene Glands: Within normal limits             Vagina: No gross lesions or discharge  Cervix: No gross lesions or discharge  Uterus  Absent  Adnexa  Without masses or tenderness  Anus and perineum  normal   Rectovaginal  normal sphincter tone without palpated masses or tenderness             Hemoccult  PCP provides     Assessment/Plan:  66 y.o. female for annual exam  with history of osteopenia due for bone density study which she will  schedule here in the office in late September. We'll check a vitamin D level today. The remainder her blood work will be drawn by her PCP. Since she had some form of high-grade dysplasia for which we do not have a report and which she had a cervical conization after supracervical hysterectomy we are doing annual Pap smears. We discussed importance of calcium vitamin D and weightbearing exercises for osteoporosis prevention. She was reminded on the importance of monthly breast exams.   Karla EdwardsFERNANDEZ,Karla Lee H MD, 9:11 AM 12/28/2016

## 2016-12-28 NOTE — Addendum Note (Signed)
Addended by: Lear NgMARTIN, MISTY L on: 12/28/2016 09:18 AM   Modules accepted: Orders

## 2016-12-29 LAB — VITAMIN D 25 HYDROXY (VIT D DEFICIENCY, FRACTURES): Vit D, 25-Hydroxy: 45 ng/mL (ref 30–100)

## 2016-12-30 LAB — PAP, TP IMAGING W/ HPV RNA, RFLX HPV TYPE 16,18/45: HPV mRNA, High Risk: NOT DETECTED

## 2017-01-28 DIAGNOSIS — M8580 Other specified disorders of bone density and structure, unspecified site: Secondary | ICD-10-CM

## 2017-01-28 HISTORY — DX: Other specified disorders of bone density and structure, unspecified site: M85.80

## 2017-02-01 ENCOUNTER — Other Ambulatory Visit: Payer: Self-pay | Admitting: Gynecology

## 2017-02-01 ENCOUNTER — Other Ambulatory Visit: Payer: Self-pay | Admitting: Obstetrics & Gynecology

## 2017-02-01 DIAGNOSIS — Z780 Asymptomatic menopausal state: Secondary | ICD-10-CM

## 2017-02-14 ENCOUNTER — Telehealth: Payer: Self-pay | Admitting: Gynecology

## 2017-02-14 ENCOUNTER — Ambulatory Visit (INDEPENDENT_AMBULATORY_CARE_PROVIDER_SITE_OTHER): Payer: Medicare Other

## 2017-02-14 ENCOUNTER — Encounter: Payer: Self-pay | Admitting: Gynecology

## 2017-02-14 DIAGNOSIS — M8589 Other specified disorders of bone density and structure, multiple sites: Secondary | ICD-10-CM | POA: Diagnosis not present

## 2017-02-14 DIAGNOSIS — Z780 Asymptomatic menopausal state: Secondary | ICD-10-CM | POA: Diagnosis not present

## 2017-02-14 NOTE — Telephone Encounter (Signed)
Patient her most recent bone density shows a slight loss of the right hip but otherwise stable. The calculated fracture risk is not increased to indicate the need for medication. Her recent vitamin D level was normal. I would recommend weightbearing exercise on a regular basis such as walking, total dietary calcium intake of 1500 mg daily and repeating the bone density in 2 years.

## 2017-02-15 ENCOUNTER — Other Ambulatory Visit: Payer: Self-pay | Admitting: Gynecology

## 2017-02-15 DIAGNOSIS — Z780 Asymptomatic menopausal state: Secondary | ICD-10-CM

## 2017-02-15 DIAGNOSIS — M8589 Other specified disorders of bone density and structure, multiple sites: Secondary | ICD-10-CM

## 2017-02-15 NOTE — Telephone Encounter (Signed)
Erroneous encounter see other telephone note

## 2017-02-16 ENCOUNTER — Encounter: Payer: Self-pay | Admitting: *Deleted

## 2017-02-16 NOTE — Telephone Encounter (Signed)
Sent pt mychart message

## 2017-02-17 NOTE — Telephone Encounter (Signed)
Patient read my chart message

## 2017-06-27 ENCOUNTER — Other Ambulatory Visit (INDEPENDENT_AMBULATORY_CARE_PROVIDER_SITE_OTHER): Payer: Self-pay | Admitting: Physician Assistant

## 2017-06-27 DIAGNOSIS — G8929 Other chronic pain: Secondary | ICD-10-CM

## 2017-06-27 DIAGNOSIS — M25562 Pain in left knee: Principal | ICD-10-CM

## 2017-07-10 ENCOUNTER — Ambulatory Visit
Admission: RE | Admit: 2017-07-10 | Discharge: 2017-07-10 | Disposition: A | Discharge status: Home / Self Care | Payer: BLUE CROSS/BLUE SHIELD | Source: Ambulatory Visit | Attending: Physician Assistant | Admitting: Physician Assistant

## 2017-07-10 DIAGNOSIS — G8929 Other chronic pain: Secondary | ICD-10-CM

## 2017-07-10 DIAGNOSIS — M25562 Pain in left knee: Principal | ICD-10-CM

## 2017-07-11 ENCOUNTER — Encounter (INDEPENDENT_AMBULATORY_CARE_PROVIDER_SITE_OTHER): Payer: Self-pay | Admitting: Orthopaedic Surgery

## 2017-07-11 ENCOUNTER — Ambulatory Visit (INDEPENDENT_AMBULATORY_CARE_PROVIDER_SITE_OTHER): Payer: Medicare Other | Admitting: Orthopaedic Surgery

## 2017-07-11 DIAGNOSIS — M25562 Pain in left knee: Secondary | ICD-10-CM
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
## 2017-09-26 ENCOUNTER — Other Ambulatory Visit: Payer: Self-pay | Admitting: Gynecology

## 2017-09-26 ENCOUNTER — Other Ambulatory Visit: Payer: Self-pay | Admitting: Obstetrics & Gynecology

## 2017-09-26 DIAGNOSIS — Z1231 Encounter for screening mammogram for malignant neoplasm of breast: Secondary | ICD-10-CM

## 2017-10-17 ENCOUNTER — Ambulatory Visit
Admission: RE | Admit: 2017-10-17 | Discharge: 2017-10-17 | Disposition: A | Discharge status: Home / Self Care | Payer: Medicare Other | Source: Ambulatory Visit | Attending: Obstetrics & Gynecology | Admitting: Obstetrics & Gynecology

## 2017-10-17 DIAGNOSIS — Z1231 Encounter for screening mammogram for malignant neoplasm of breast: Secondary | ICD-10-CM

## 2017-12-29 ENCOUNTER — Encounter: Payer: Medicare Other | Admitting: Obstetrics & Gynecology

## 2017-12-30 ENCOUNTER — Encounter (HOSPITAL_BASED_OUTPATIENT_CLINIC_OR_DEPARTMENT_OTHER): Payer: Self-pay | Admitting: *Deleted

## 2017-12-30 ENCOUNTER — Emergency Department (HOSPITAL_BASED_OUTPATIENT_CLINIC_OR_DEPARTMENT_OTHER)
Admission: EM | Admit: 2017-12-30 | Discharge: 2017-12-30 | Disposition: A | Discharge status: Home / Self Care | Payer: Medicare Other | Attending: Emergency Medicine | Admitting: Emergency Medicine

## 2017-12-30 ENCOUNTER — Other Ambulatory Visit: Payer: Self-pay

## 2017-12-30 DIAGNOSIS — Y999 Unspecified external cause status: Secondary | ICD-10-CM | POA: Insufficient documentation

## 2017-12-30 DIAGNOSIS — S0101XA Laceration without foreign body of scalp, initial encounter: Secondary | ICD-10-CM

## 2017-12-30 DIAGNOSIS — Y9289 Other specified places as the place of occurrence of the external cause: Secondary | ICD-10-CM | POA: Insufficient documentation

## 2017-12-30 DIAGNOSIS — Y9316 Activity, rowing, canoeing, kayaking, rafting and tubing: Secondary | ICD-10-CM | POA: Insufficient documentation

## 2017-12-30 DIAGNOSIS — W228XXA Striking against or struck by other objects, initial encounter: Secondary | ICD-10-CM | POA: Diagnosis not present

## 2017-12-30 DIAGNOSIS — Z79899 Other long term (current) drug therapy: Secondary | ICD-10-CM | POA: Diagnosis not present

## 2017-12-30 DIAGNOSIS — Z23 Encounter for immunization: Secondary | ICD-10-CM | POA: Diagnosis not present

## 2017-12-30 MED ORDER — TETANUS-DIPHTH-ACELL PERTUSSIS 5-2.5-18.5 LF-MCG/0.5 IM SUSP
0.5000 mL | Freq: Once | INTRAMUSCULAR | Status: AC
  Administered 2017-12-30: 0.5 mL via INTRAMUSCULAR
  Filled 2017-12-30: qty 0.5

## 2017-12-30 MED ORDER — DOXYCYCLINE HYCLATE 100 MG PO CAPS: 100.0000 mg | ORAL_CAPSULE | Freq: Two times a day (BID) | ORAL | 0 refills | Status: DC

## 2017-12-30 NOTE — ED Notes (Signed)
Pt reports "severe reaction" to last tetanus shot, when she was 67 years old.

## 2017-12-30 NOTE — Discharge Instructions (Signed)
Take doxycycline to help prevent infection.  Apply antibiotic ointment twice daily.  You can get the wound wet after 24 hours.  Do not submerge while staples are in place.  Please go to your doctor in 1 week or return here for staple removal.  Please return to emergency department if you develop any increasing pain, redness, swelling, drainage from the area, severe headache, severe nausea, vomiting, dizziness, lightheadedness.
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
## 2018-01-15 ENCOUNTER — Ambulatory Visit (INDEPENDENT_AMBULATORY_CARE_PROVIDER_SITE_OTHER): Payer: Medicare Other | Admitting: Obstetrics & Gynecology

## 2018-01-15 ENCOUNTER — Encounter: Payer: Self-pay | Admitting: Obstetrics & Gynecology

## 2018-01-15 VITALS — BP 140/86 | Ht 65.0 in | Wt 151.0 lb

## 2018-01-15 DIAGNOSIS — Z01419 Encounter for gynecological examination (general) (routine) without abnormal findings: Secondary | ICD-10-CM

## 2018-01-15 DIAGNOSIS — Z9189 Other specified personal risk factors, not elsewhere classified: Secondary | ICD-10-CM

## 2018-01-15 DIAGNOSIS — M8589 Other specified disorders of bone density and structure, multiple sites: Secondary | ICD-10-CM

## 2018-01-15 DIAGNOSIS — Z780 Asymptomatic menopausal state: Secondary | ICD-10-CM

## 2018-01-15 DIAGNOSIS — Z90711 Acquired absence of uterus with remaining cervical stump: Secondary | ICD-10-CM

## 2018-01-15 NOTE — Addendum Note (Signed)
Addended by: Berna SpareASTILLO, BLANCA A on: 01/15/2018 12:53 PM   Modules accepted: Orders

## 2018-01-15 NOTE — Progress Notes (Signed)
Karla Lee [birth year] [id number]   History:    67 y.o. [id number] Married. Has a total of 9 grand-children.  RP:  Established patient presenting for annual gyn exam   HPI: S/P Supracervical Hysterectomy.  Menopause, well on no HRT.  No PMB. Remote h/o LEEP.  Pap neg/HPV HR neg 12/2016.  No pelvic pain.  No pain with IC.  Urine/BMs wnl.  BMI 25.13.  Goes to the gym 3x/week.  Taking Vit D/Ca++.  Breasts normal.  Fasting labs with Fam MD.  Will schedule [facility] now.  Past medical history,surgical history, family history and social history were all reviewed and documented in the EPIC chart.  Gynecologic History No LMP recorded. Patient has had a hysterectomy. Contraception: status post hysterectomy/Menopause Last Pap: 12/2016. Results were: Negative/HPV HR neg Last mammogram: 09/2017. Results were: Negative Bone Density: 01/2017.  Osteopenia at all sites, lowest T-Score -2.3 at Rt femoral neck and AP Spine Colonoscopy: 2009, will schedule now  Obstetric History OB History  Gravida Para Term Preterm AB Living  4 2     2 2   SAB TAB Ectopic Multiple Live Births  2            # Outcome Date GA Lbr Len/2nd Weight Sex Delivery Anes PTL Lv  4 SAB           3 SAB           2 Para           1 Para              ROS: A ROS was performed and pertinent positives and negatives are included in the history.  GENERAL: No fevers or chills. HEENT: No change in vision, no earache, sore throat or sinus congestion. NECK: No pain or stiffness. CARDIOVASCULAR: No chest pain or pressure. No palpitations. PULMONARY: No shortness of breath, cough or wheeze. GASTROINTESTINAL: No abdominal pain, nausea, vomiting or diarrhea, melena or bright red blood per rectum. GENITOURINARY: No urinary frequency, urgency, hesitancy or dysuria. MUSCULOSKELETAL: No joint or muscle pain, no back pain, no recent trauma. DERMATOLOGIC: No rash, no itching, no lesions. ENDOCRINE: No polyuria, polydipsia, no heat or cold  intolerance. No recent change in weight. HEMATOLOGICAL: No anemia or easy bruising or bleeding. NEUROLOGIC: No headache, seizures, numbness, tingling or weakness. PSYCHIATRIC: No depression, no loss of interest in normal activity or change in sleep pattern.     Exam:   BP 140/86   Ht 5\' 5"  (1.651 m)   Wt 151 lb (68.5 kg)   BMI 25.13 kg/m   Body mass index is 25.13 kg/m.  General appearance : Well developed well nourished female. No acute distress HEENT: Eyes: no retinal hemorrhage or exudates,  Neck supple, trachea midline, no carotid bruits, no thyroidmegaly Lungs: Clear to auscultation, no rhonchi or wheezes, or rib retractions  Heart: Regular rate and rhythm, no murmurs or gallops Breast:Examined in sitting and supine position were symmetrical in appearance, no palpable masses or tenderness,  no skin retraction, no nipple inversion, no nipple discharge, no skin discoloration, no axillary or supraclavicular lymphadenopathy Abdomen: no palpable masses or tenderness, no rebound or guarding Extremities: no edema or skin discoloration or tenderness  Pelvic: Vulva: Normal             Vagina: No gross lesions or discharge  Cervix: No gross lesions or discharge.  Pap reflex done  Uterus  Absent  Adnexa  Without masses or tenderness  Anus: Normal  Assessment/Plan:  67 y.o. female for annual exam   1. Encounter for routine gynecological examination with Papanicolaou smear of cervix Gynecologic exam status post supracervical hysterectomy and menopause.  Remote history of LEEP.  High risk HPV negative last year.  Pap reflex done today.  Breast exam normal.  Screening mammogram negative in May 2019.  Patient will schedule colonoscopy now.  Health labs with family physician.  Good body mass index at 25.13.  Exercises regularly and has a healthy nutrition.  2. History of hysterectomy, supracervical  3. Menopause present Well on no hormone replacement therapy.  No postmenopausal  bleeding.  4. Osteopenia of multiple sites Bone density in September 2018 showed osteopenia with a T score of -2.3 at the spine and the right femoral neck.  Recommend continuing on vitamin D and calcium supplements.  Total calcium recommended is 1.2 to 1.5 g a day.  Continue with weightbearing physical activity on a regular basis.  We will repeat the bone density in September 2020.  Karla DelMarie-Lyne Hillman Attig MD, 11:03 AM 01/15/2018

## 2018-01-15 NOTE — Patient Instructions (Signed)
1. Encounter for routine gynecological examination with Papanicolaou smear of cervix Gynecologic exam status post supracervical hysterectomy and menopause.  Remote history of LEEP.  High risk HPV negative last year.  Pap reflex done today.  Breast exam normal.  Screening mammogram negative in May 2019.  Patient will schedule colonoscopy now.  Health labs with family physician.  Good body mass index at 25.13.  Exercises regularly and has a healthy nutrition.  2. History of hysterectomy, supracervical  3. Menopause present Well on no hormone replacement therapy.  No postmenopausal bleeding.  4. Osteopenia of multiple sites Bone density in September 2018 showed osteopenia with a T score of -2.3 at the spine and the right femoral neck.  Recommend continuing on vitamin D and calcium supplements.  Total calcium recommended is 1.2 to 1.5 g a day.  Continue with weightbearing physical activity on a regular basis.  We will repeat the bone density in September 2020.  Elease Hashimotoatricia, it was a pleasure meeting you today!  I will inform you of your results as soon as they are available.

## 2018-01-16 LAB — PAP IG W/ RFLX HPV ASCU

## 2018-09-06 ENCOUNTER — Other Ambulatory Visit: Payer: Self-pay | Admitting: Family Medicine

## 2018-09-06 DIAGNOSIS — Z1231 Encounter for screening mammogram for malignant neoplasm of breast: Secondary | ICD-10-CM

## 2018-11-08 ENCOUNTER — Ambulatory Visit
Admission: RE | Admit: 2018-11-08 | Discharge: 2018-11-08 | Disposition: A | Discharge status: Home / Self Care | Payer: Medicare Other | Source: Ambulatory Visit | Attending: Family Medicine | Admitting: Family Medicine

## 2018-11-08 ENCOUNTER — Other Ambulatory Visit: Payer: Self-pay

## 2018-11-08 DIAGNOSIS — Z1231 Encounter for screening mammogram for malignant neoplasm of breast: Secondary | ICD-10-CM

## 2019-01-16 ENCOUNTER — Other Ambulatory Visit: Payer: Self-pay

## 2019-01-17 ENCOUNTER — Encounter: Payer: Self-pay | Admitting: Obstetrics & Gynecology

## 2019-01-17 ENCOUNTER — Ambulatory Visit (INDEPENDENT_AMBULATORY_CARE_PROVIDER_SITE_OTHER): Payer: Medicare Other | Admitting: Obstetrics & Gynecology

## 2019-01-17 VITALS — BP 136/90 | Ht 65.0 in | Wt 157.0 lb

## 2019-01-17 DIAGNOSIS — Z01419 Encounter for gynecological examination (general) (routine) without abnormal findings: Secondary | ICD-10-CM

## 2019-01-17 DIAGNOSIS — Z780 Asymptomatic menopausal state: Secondary | ICD-10-CM

## 2019-01-17 DIAGNOSIS — M8589 Other specified disorders of bone density and structure, multiple sites: Secondary | ICD-10-CM

## 2019-01-17 NOTE — Progress Notes (Signed)
Karla Lee [facility] 04-Jul-1950 [id number]   History:    68 y.o. G4P2A2L2 Married. Has a total of 9 grand-children.  RP:  Established patient presenting for annual gyn exam   HPI: S/P Supracervical Hysterectomy.  Menopause, well on no HRT.  No PMB. Remote h/o LEEP.  Pap neg/HPV HR neg 12/2017.  No pelvic pain.  No pain with IC.  Urine/BMs wnl.  BMI 26.13.  Goes to the gym 3x/week.  Taking Vit D/Ca++.  Breasts normal.  Fasting labs with Fam MD.  Will schedule Colono now.   Past medical history,surgical history, family history and social history were all reviewed and documented in the EPIC chart.  Gynecologic History No LMP recorded. Patient has had a hysterectomy. Contraception: status post hysterectomy Last Pap: 12/2017. Results were: Negative Last mammogram: 10/2018. Results were: Negative Bone Density: 01/2017 Osteopenia Colonoscopy: 2010  Obstetric History OB History  Gravida Para Term Preterm AB Living  4 2     2 2   SAB TAB Ectopic Multiple Live Births  2            # Outcome Date GA Lbr Len/2nd Weight Sex Delivery Anes PTL Lv  4 SAB           3 SAB           2 Para           1 Para              ROS: A ROS was performed and pertinent positives and negatives are included in the history.  GENERAL: No fevers or chills. HEENT: No change in vision, no earache, sore throat or sinus congestion. NECK: No pain or stiffness. CARDIOVASCULAR: No chest pain or pressure. No palpitations. PULMONARY: No shortness of breath, cough or wheeze. GASTROINTESTINAL: No abdominal pain, nausea, vomiting or diarrhea, melena or bright red blood per rectum. GENITOURINARY: No urinary frequency, urgency, hesitancy or dysuria. MUSCULOSKELETAL: No joint or muscle pain, no back pain, no recent trauma. DERMATOLOGIC: No rash, no itching, no lesions. ENDOCRINE: No polyuria, polydipsia, no heat or cold intolerance. No recent change in weight. HEMATOLOGICAL: No anemia or easy bruising or bleeding.  NEUROLOGIC: No headache, seizures, numbness, tingling or weakness. PSYCHIATRIC: No depression, no loss of interest in normal activity or change in sleep pattern.     Exam:   BP 136/90   Ht 5\' 5"  (1.651 m)   Wt 157 lb (71.2 kg)   BMI 26.13 kg/m   Body mass index is 26.13 kg/m.  General appearance : Well developed well nourished female. No acute distress HEENT: Eyes: no retinal hemorrhage or exudates,  Neck supple, trachea midline, no carotid bruits, no thyroidmegaly Lungs: Clear to auscultation, no rhonchi or wheezes, or rib retractions  Heart: Regular rate and rhythm, no murmurs or gallops Breast:Examined in sitting and supine position were symmetrical in appearance, no palpable masses or tenderness,  no skin retraction, no nipple inversion, no nipple discharge, no skin discoloration, no axillary or supraclavicular lymphadenopathy Abdomen: no palpable masses or tenderness, no rebound or guarding Extremities: no edema or skin discoloration or tenderness  Pelvic: Vulva: Normal             Vagina: No gross lesions or discharge  Cervix: No gross lesions or discharge.  Pap reflex done.  Uterus  AV, normal size, shape and consistency, non-tender and mobile  Adnexa  Without masses or tenderness  Anus: Normal   Assessment/Plan:  68 y.o. female for annual exam  1. Encounter for routine gynecological examination with Papanicolaou smear of cervix Normal gynecologic exam in menopause.  Pap reflex done.  Breast exam normal.  Last screening mammogram June 2020 was negative.  Colonoscopy in 2010, patient will schedule repeat colonoscopy now.  Health labs with family physician. - Pap IG w/ reflex to HPV when ASC-U  2. Postmenopause Well on no hormone replacement therapy.  No postmenopausal bleeding.  3. Osteopenia of multiple sites Osteopenia per last bone density September 2018.  We will repeat a bone density in September 2020.  Vitamin D supplements, calcium intake of 1200 mg daily and  regular weightbearing physical activity is recommended. - DG Bone Density; Future  Other orders - magnesium 30 MG tablet; Take 30 mg by mouth 2 (two) times daily.  Genia DelMarie-Lyne Demosthenes Virnig MD, 10:06 AM 01/17/2019

## 2019-01-18 LAB — PAP IG W/ RFLX HPV ASCU

## 2019-01-21 ENCOUNTER — Encounter: Payer: Self-pay | Admitting: Obstetrics & Gynecology

## 2019-01-21 NOTE — Patient Instructions (Signed)
1. Encounter for routine gynecological examination with Papanicolaou smear of cervix Normal gynecologic exam in menopause.  Pap reflex done.  Breast exam normal.  Last screening mammogram June 2020 was negative.  Colonoscopy in 2010, patient will schedule repeat colonoscopy now.  Health labs with family physician. - Pap IG w/ reflex to HPV when ASC-U  2. Postmenopause Well on no hormone replacement therapy.  No postmenopausal bleeding.  3. Osteopenia of multiple sites Osteopenia per last bone density September 2018.  We will repeat a bone density in September 2020.  Vitamin D supplements, calcium intake of 1200 mg daily and regular weightbearing physical activity is recommended. - DG Bone Density; Future  Other orders - magnesium 30 MG tablet; Take 30 mg by mouth 2 (two) times daily.  Karla Lee, it was a pleasure seeing you today!  I will inform you of your results as soon as they are available.

## 2019-02-25 ENCOUNTER — Other Ambulatory Visit: Payer: Self-pay

## 2019-02-26 ENCOUNTER — Other Ambulatory Visit: Payer: Self-pay | Admitting: Obstetrics & Gynecology

## 2019-02-26 ENCOUNTER — Ambulatory Visit (INDEPENDENT_AMBULATORY_CARE_PROVIDER_SITE_OTHER): Payer: Medicare Other

## 2019-02-26 DIAGNOSIS — M8589 Other specified disorders of bone density and structure, multiple sites: Secondary | ICD-10-CM

## 2019-02-26 DIAGNOSIS — Z780 Asymptomatic menopausal state: Secondary | ICD-10-CM

## 2019-03-06 ENCOUNTER — Encounter: Payer: Self-pay | Admitting: Gynecology

## 2019-03-19 ENCOUNTER — Telehealth: Payer: Self-pay

## 2019-03-19 NOTE — Telephone Encounter (Signed)
Patient e-mailed me because she never received her BD results. She is going to see PCP tomorrow and wanted to review results over the phone. I called her and discussed results/recommendations. I sent her a copy of the results again.

## 2019-11-04 ENCOUNTER — Other Ambulatory Visit: Payer: Self-pay | Admitting: Family Medicine

## 2019-11-04 DIAGNOSIS — Z1231 Encounter for screening mammogram for malignant neoplasm of breast: Secondary | ICD-10-CM

## 2019-11-12 ENCOUNTER — Ambulatory Visit
Admission: RE | Admit: 2019-11-12 | Discharge: 2019-11-12 | Disposition: A | Discharge status: Home / Self Care | Payer: Medicare Other | Source: Ambulatory Visit | Attending: Family Medicine | Admitting: Family Medicine

## 2019-11-12 ENCOUNTER — Other Ambulatory Visit: Payer: Self-pay

## 2019-11-12 DIAGNOSIS — Z1231 Encounter for screening mammogram for malignant neoplasm of breast: Secondary | ICD-10-CM

## 2020-01-20 ENCOUNTER — Encounter: Payer: Self-pay | Admitting: Obstetrics & Gynecology

## 2020-01-20 ENCOUNTER — Ambulatory Visit (INDEPENDENT_AMBULATORY_CARE_PROVIDER_SITE_OTHER): Payer: Medicare Other | Admitting: Obstetrics & Gynecology

## 2020-01-20 ENCOUNTER — Other Ambulatory Visit: Payer: Self-pay

## 2020-01-20 VITALS — BP 122/82 | Ht 65.0 in | Wt 152.6 lb

## 2020-01-20 DIAGNOSIS — Z01419 Encounter for gynecological examination (general) (routine) without abnormal findings: Secondary | ICD-10-CM

## 2020-01-20 DIAGNOSIS — Z780 Asymptomatic menopausal state: Secondary | ICD-10-CM

## 2020-01-20 DIAGNOSIS — M8589 Other specified disorders of bone density and structure, multiple sites: Secondary | ICD-10-CM | POA: Diagnosis not present

## 2020-01-20 NOTE — Progress Notes (Signed)
Karla Lee [facility] [birth year] [id number]   History:    69 y.o. G4P2A2L2 Married. Has a total of 9 grand-children.  IE:[id number] presenting for annual gyn exam   HPI:S/P Supracervical Hysterectomy. Menopause, well on no HRT. No PMB. Remote h/o LEEP. Pap neg 12/2018. No pelvic pain. No pain with IC. Urine/BMs wnl. BMI 25.39. Goes to the gym 3x/week. Taking Vit D/Ca++. Breasts normal. Fasting labs with Fam MD.  [name] 2021.  Past medical history,surgical history, family history and social history were all reviewed and documented in the EPIC chart.  Gynecologic History No LMP recorded. Patient has had a hysterectomy.  Obstetric History OB History  Gravida Para Term Preterm AB Living  4 2     2 2   SAB TAB Ectopic Multiple Live Births  2            # Outcome Date GA Lbr Len/2nd Weight Sex Delivery Anes PTL Lv  4 SAB           3 SAB           2 Para           1 Para              ROS: A ROS was performed and pertinent positives and negatives are included in the history.  GENERAL: No fevers or chills. HEENT: No change in vision, no earache, sore throat or sinus congestion. NECK: No pain or stiffness. CARDIOVASCULAR: No chest pain or pressure. No palpitations. PULMONARY: No shortness of breath, cough or wheeze. GASTROINTESTINAL: No abdominal pain, nausea, vomiting or diarrhea, melena or bright red blood per rectum. GENITOURINARY: No urinary frequency, urgency, hesitancy or dysuria. MUSCULOSKELETAL: No joint or muscle pain, no back pain, no recent trauma. DERMATOLOGIC: No rash, no itching, no lesions. ENDOCRINE: No polyuria, polydipsia, no heat or cold intolerance. No recent change in weight. HEMATOLOGICAL: No anemia or easy bruising or bleeding. NEUROLOGIC: No headache, seizures, numbness, tingling or weakness. PSYCHIATRIC: No depression, no loss of interest in normal activity or change in sleep pattern.     Exam:   BP 122/82   Ht 5\' 5"  (1.651 m)    Wt 152 lb 9.6 oz (69.2 kg)   BMI 25.39 kg/m   Body mass index is 25.39 kg/m.  General appearance : Well developed well nourished female. No acute distress HEENT: Eyes: no retinal hemorrhage or exudates,  Neck supple, trachea midline, no carotid bruits, no thyroidmegaly Lungs: Clear to auscultation, no rhonchi or wheezes, or rib retractions  Heart: Regular rate and rhythm, no murmurs or gallops Breast:Examined in sitting and supine position were symmetrical in appearance, no palpable masses or tenderness,  no skin retraction, no nipple inversion, no nipple discharge, no skin discoloration, no axillary or supraclavicular lymphadenopathy Abdomen: no palpable masses or tenderness, no rebound or guarding Extremities: no edema or skin discoloration or tenderness  Pelvic: Vulva: Normal             Vagina: No gross lesions or discharge  Cervix: No gross lesions or discharge  Uterus  Absent  Adnexa  Without masses or tenderness  Anus: Normal   Assessment/Plan:  69 y.o. female for annual exam   1. Well female exam with routine gynecological exam Gynecologic exam status post supra cervical hysterectomy.  No indication to repeat a Pap test this year.  Breast exam normal.  Last screening mammogram June 2021 was negative.  Colonoscopy 2021.  Health labs with family physician.  Good body mass index at  25.39.  Continue with fitness and healthy nutrition.  2. Postmenopause Well on no hormone replacement therapy.  3. Osteopenia of multiple sites Osteopenia on last bone density September 2020.  We will repeat a bone density in September 2022.  Vitamin D supplements, calcium intake of 1200 mg to 1500 mg daily.  Weightbearing physical activities recommended.  Other orders - doxycycline (DORYX) 100 MG EC tablet; Take 100 mg by mouth 2 (two) times daily.  Genia Del MD, 10:09 AM 01/20/2020

## 2020-01-21 ENCOUNTER — Encounter: Payer: Self-pay | Admitting: Obstetrics & Gynecology

## 2020-02-24 ENCOUNTER — Ambulatory Visit (INDEPENDENT_AMBULATORY_CARE_PROVIDER_SITE_OTHER): Payer: Medicare Other | Admitting: Orthopaedic Surgery

## 2020-02-24 ENCOUNTER — Other Ambulatory Visit: Payer: Self-pay

## 2020-02-24 ENCOUNTER — Ambulatory Visit: Payer: Self-pay

## 2020-02-24 DIAGNOSIS — M25512 Pain in left shoulder: Secondary | ICD-10-CM | POA: Diagnosis not present

## 2020-02-24 DIAGNOSIS — G8929 Other chronic pain: Secondary | ICD-10-CM

## 2020-02-24 MED ORDER — METHYLPREDNISOLONE ACETATE 40 MG/ML IJ SUSP
40.0000 mg | INTRAMUSCULAR | Status: AC | PRN
  Administered 2020-02-24: 40 mg via INTRA_ARTICULAR

## 2020-02-24 MED ORDER — LIDOCAINE HCL 1 % IJ SOLN
3.0000 mL | INTRAMUSCULAR | Status: AC | PRN
  Administered 2020-02-24: 3 mL

## 2020-02-24 NOTE — Progress Notes (Signed)
Office Visit Note   Patient: Karla Lee [facility]           Date of Birth: September 24, 1950           MRN: [id number] Visit Date: 02/24/2020              Requested by: Tracey Harries, MD [address] [place],  [place] [address] PCP: Tracey Harries, MD   Assessment & Plan: Visit Diagnoses:  1. Chronic left shoulder pain     Plan: In order to calm down the pain in the shoulder, I have recommended a steroid injection to the left shoulder subacromial space.  I do feel that she needs outpatient physical therapy as well and she agrees to this based on her limitations in motion.  All question concerns were answered and addressed.  She tolerated the steroid injection well.  We will work on setting up outpatient physical therapy for her and then I will see her back in 4 weeks to see how she is doing overall.  Follow-Up Instructions: Return in about 4 weeks (around 03/23/2020).   Orders:  Orders Placed This Encounter  Procedures  . Large Joint Inj  . XR Shoulder Left   No orders of the defined types were placed in this encounter.     Procedures: Large Joint Inj: L subacromial bursa on 02/24/2020 11:02 AM Indications: pain and diagnostic evaluation Details: 22 G 1.5 in needle  Arthrogram: No  Medications: 3 mL lidocaine 1 %; 40 mg methylPREDNISolone acetate 40 MG/ML Outcome: tolerated well, no immediate complications Procedure, treatment alternatives, risks and benefits explained, specific risks discussed. Consent was given by the patient. Immediately prior to procedure a time out was called to verify the correct patient, procedure, equipment, support staff and site/side marked as required. Patient was prepped and draped in the usual sterile fashion.       Clinical Data: No additional findings.   Subjective: Chief Complaint  Patient presents with  . Left Shoulder - Pain  The patient comes in today with chief complaint of left shoulder pain.  It has been  hurting for several months now and got worse after having Covid vaccination.  And now hurts with activities and reaching behind her.  She does have a history of right shoulder surgery that we did years ago.  This pain is now waking up at night she reports painful motion and decreased strength.  Reaching behind her and overhead is become more painful.  She is otherwise had no acute change in her medical status.  She denies any injuries to the left shoulder and has never had left shoulder surgery.  HPI  Review of Systems There is currently no headache, chest pain, shortness of breath, fever, chills, nausea, vomiting  Objective: Vital Signs: There were no vitals taken for this visit.  Physical Exam She is alert and oriented x3 and in no acute distress Ortho Exam Examination of her left shoulder shows limitations with internal rotation and adduction with reaching behind her is very painful.  She has positive Neer and Hawkins signs.  There is no redness about her shoulder and is clinically well located.  She abducts her shoulder appropriately and has good strength in the rotator cuff. Specialty Comments:  No specialty comments available.  Imaging: XR Shoulder Left  Result Date: 02/24/2020 3 views of the left shoulder show well located shoulder with no acute findings.  There is slight calcifications of the insertion of the rotator cuff.  PMFS History: Patient Active Problem List   Diagnosis Date Noted  . History of cervical dysplasia 12/28/2016  . Left knee pain 12/26/2016  . Hyperlipidemia 12/10/2012  . Vaginal atrophy 12/10/2012  . Osteopenia 12/10/2012   Past Medical History:  Diagnosis Date  . High cholesterol   . Osteopenia 01/2017   T score -2.3 FRAX 9.5%/1.4%    Family History  Problem Relation Age of Onset  . Heart disease Father     Past Surgical History:  Procedure Laterality Date  . ABDOMINAL HYSTERECTOMY  1999   SUPRACERVICAL HYSTERECTOMY   . CERVICAL BIOPSY  W/  LOOP ELECTRODE EXCISION     2003  . SHOULDER SURGERY  04/2014   arhroscopic   Social History   Occupational History  . Not on file  Tobacco Use  . Smoking status: Never Smoker  . Smokeless tobacco: Never Used  Vaping Use  . Vaping Use: Never used  Substance and Sexual Activity  . Alcohol use: Yes    Comment: WINE  . Drug use: No  . Sexual activity: Yes    Birth control/protection: Post-menopausal

## 2020-02-26 ENCOUNTER — Ambulatory Visit: Payer: Medicare Other | Attending: Orthopaedic Surgery | Admitting: Physical Therapy

## 2020-02-26 ENCOUNTER — Encounter: Payer: Self-pay | Admitting: Physical Therapy

## 2020-02-26 ENCOUNTER — Other Ambulatory Visit: Payer: Self-pay

## 2020-02-26 DIAGNOSIS — M25512 Pain in left shoulder: Secondary | ICD-10-CM | POA: Insufficient documentation

## 2020-02-26 DIAGNOSIS — M25612 Stiffness of left shoulder, not elsewhere classified: Secondary | ICD-10-CM

## 2020-02-26 DIAGNOSIS — R29898 Other symptoms and signs involving the musculoskeletal system: Secondary | ICD-10-CM

## 2020-02-26 DIAGNOSIS — G8929 Other chronic pain: Secondary | ICD-10-CM

## 2020-02-26 NOTE — Therapy (Signed)
[facility] [address]  [address] [place], [place], [address] Phone: [phone]   Fax:  [phone]  Physical Therapy Evaluation  Patient Details  Name: Karla Lee MRN: [id number] Date of Birth: [birth year] Referring Provider (PT): Doneen Poisson, MD   Encounter Date: 02/26/2020   PT End of Session - 02/26/20 1445    Visit Number 1    Number of Visits 10    Date for PT Re-Evaluation 04/15/20    Authorization Type Medicare & BCBS    PT Start Time 1401    PT Stop Time 1440    PT Time Calculation (min) 39 min    Activity Tolerance Patient tolerated treatment well;Patient limited by pain    Behavior During Therapy [facility] for tasks assessed/performed           Past Medical History:  Diagnosis Date  . High cholesterol   . Osteopenia 01/2017   T score -2.3 FRAX 9.5%/1.4%    Past Surgical History:  Procedure Laterality Date  . ABDOMINAL HYSTERECTOMY  1999   SUPRACERVICAL HYSTERECTOMY   . CERVICAL BIOPSY  W/ LOOP ELECTRODE EXCISION     2003  . SHOULDER SURGERY  04/2014   arhroscopic    There were no vitals filed for this visit.    Subjective Assessment - 02/26/20 1402    Subjective Patient reports that in April she had a COVID shot- noticed some swelling but tried to work through it and continued going to the gym. Pain is deep and occurs over the upper humerus. Worse when reaching overhead and behind the back. Patient is active and goes to the gym 3x/a week. Received a cortisone injection on Monday but without any benefit thus far. Denies N/T or radiation.    Pertinent History osteopenia, HLD, L shoulder arthroscopy 2015    Limitations Lifting;House hold activities    Diagnostic tests 02/24/20 L shoulder xray: 3 views of the left shoulder show well located shoulder with no acute findings.  There is slight calcifications of the insertion of the rotator cuff.    Patient Stated Goals get rid pain  and avoid surgery    Currently in Pain? Yes    Pain Score 4     Pain Location Shoulder    Pain Orientation Left    Pain Descriptors / Indicators Constant    Pain Type Chronic pain              [facility] PT Assessment - 02/26/20 1407      Assessment   Medical Diagnosis Chronic L shoulder pain    Referring Provider (PT) Doneen Poisson, MD    Onset Date/Surgical Date 09/07/19    Hand Dominance Right    Next MD Visit 03/24/20    Prior Therapy yes- opposite shoulder      Precautions   Precautions None      Balance Screen   Has the patient fallen in the past 6 months No    Has the patient had a decrease in activity level because of a fear of falling?  No    Is the patient reluctant to leave their home because of a fear of falling?  No      Home Nurse, mental health Private residence    Living Arrangements Spouse/significant other    Available Help at Discharge Family    Type of Home House      Prior Function   Level of Independence Independent  Vocation Retired    Visual merchandiser, Sales promotion account executive   Overall Cognitive Status Within [employer] for tasks assessed      Physiological scientist Movements are Fluid and Coordinated Yes      Posture/Postural Control   Posture/Postural Control No significant limitations      ROM / Strength   AROM / PROM / Strength AROM;PROM;Strength      AROM   AROM Assessment Site Shoulder    Right/Left Shoulder Left;Right    Right Shoulder Flexion 161 Degrees    Right Shoulder ABduction 161 Degrees    Right Shoulder Internal Rotation --   FIT lateral to T 10   Right Shoulder External Rotation --   FER T1   Left Shoulder Flexion 119 Degrees   mild pain   Left Shoulder ABduction 155 Degrees   moderate pain   Left Shoulder Internal Rotation --   FIR lateral to T12; severe pain   Left Shoulder External Rotation --   FER lateral to T1     PROM   Overall PROM  Comments pain at end ranges    PROM Assessment Site Shoulder    Right/Left Shoulder Left    Left Shoulder Flexion 149 Degrees    Left Shoulder ABduction 150 Degrees    Left Shoulder Internal Rotation 69 Degrees    Left Shoulder External Rotation 35 Degrees      Strength   Strength Assessment Site Shoulder    Right/Left Shoulder Right;Left    Right Shoulder Flexion 4+/5    Right Shoulder ABduction 4+/5    Right Shoulder Internal Rotation 4+/5    Right Shoulder External Rotation 4+/5    Left Shoulder Flexion 4+/5   mild pain   Left Shoulder ABduction 4/5   mild pain   Left Shoulder Internal Rotation 4/5    Left Shoulder External Rotation 4/5      Palpation   Palpation comment TTP over B pec and L infraspinatus insertion; significant soft tissue restriction over B UT;                       Objective measurements completed on examination: See above findings.               PT Education - 02/26/20 1445    Education Details prognosis, POC, HEP; advised to continue gym workouts to tolerance    Person(s) Educated Patient    Methods Explanation;Demonstration;Tactile cues;Verbal cues;Handout    Comprehension Verbalized understanding;Returned demonstration            PT Short Term Goals - 02/26/20 1450      PT SHORT TERM GOAL #1   Title Patient to be independent with initial HEP.    Time 2    Period Weeks    Status New    Target Date 03/11/20             PT Long Term Goals - 02/26/20 1451      PT LONG TERM GOAL #1   Title Patient to be independent with advanced HEP.    Time 7    Period Weeks    Status New    Target Date 04/15/20      PT LONG TERM GOAL #2   Title Patient to demonstrate L shoulder AROM/PROM [facility] and without pain limiting.    Time 7    Period  Weeks    Status New    Target Date 04/15/20      PT LONG TERM GOAL #3   Title Patient to demonstrate L shoulder strength >/=4+/5.    Time 7    Period Weeks    Status New    Target  Date 04/15/20      PT LONG TERM GOAL #4   Title Patient to report 75% improvement in ability to reach behind the back.    Time 7    Period Weeks    Status New    Target Date 04/15/20                  Plan - 02/26/20 1446    Clinical Impression Statement Patient is a 68y/o F presenting to OPPT with c/o chronic L shoulder pain after receiving her COVID shot in April 2021. Pain is deep and occurs over the upper humerus. Worse when reaching overhead and behind the back. Denies N/T or radiation. Patient today presenting with limited and painful L shoulder ROM, decreased L shoulder strength, TTP over B pec and L infraspinatus insertion, and with significant soft tissue restriction over B UT. 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 Patient was educated on gentle AAROM HEP- patient reported understanding. Would benefit from skilled PT services 2x/week for 3 weeks and 1x/week for 3 weeks to address aforementioned impairments.    Personal Factors and Comorbidities Age;Comorbidity 3+;Past/Current Experience;Time since onset of injury/illness/exacerbation    Comorbidities osteopenia, HLD, L shoulder arthroscopy 2015    Examination-Activity Limitations Sleep;Carry;Dressing;Hygiene/Grooming;Lift;Reach Overhead    Examination-Participation Restrictions Cleaning;Community Activity;Shop;Driving;Yard Work;Laundry;Meal Prep    Stability/Clinical Decision Making Stable/Uncomplicated    Clinical Decision Making Low    Rehab Potential Good    PT Frequency Other (comment)   2x/week for 3 weeks and 1x/week for 3 weeks   PT Duration Other (comment)    PT Treatment/Interventions ADLs/Self Care Home Management;Cryotherapy;Electrical Stimulation;Iontophoresis 4mg /ml Dexamethasone;Moist Heat;Therapeutic exercise;Therapeutic activities;Functional mobility training;Ultrasound;Neuromuscular re-education;Patient/family education;Manual techniques;Vasopneumatic Device;Taping;Energy  conservation;Dry needling;Passive range of motion    PT Next Visit Plan shoulder FOTO; reassess HEP; progress shoulder PROM/AROM to tolerance    Consulted and Agree with Plan of Care Patient           Patient will benefit from skilled therapeutic intervention in order to improve the following deficits and impairments:  Hypomobility, Increased edema, Decreased activity tolerance, Decreased strength, Increased fascial restricitons, Impaired UE functional use, Pain, Increased muscle spasms, Improper body mechanics, Decreased range of motion, Impaired flexibility, Postural dysfunction  Visit Diagnosis: Chronic left shoulder pain  Stiffness of left shoulder, not elsewhere classified  Other symptoms and signs involving the musculoskeletal system     Problem List Patient Active Problem List   Diagnosis Date Noted  . History of cervical dysplasia 12/28/2016  . Left knee pain 12/26/2016  . Hyperlipidemia 12/10/2012  . Vaginal atrophy 12/10/2012  . Osteopenia 12/10/2012    12/12/2012, PT, DPT 02/26/20 2:54 PM    [facility] [address]  [address] [place], [place], [place] Phone: [phone]   Fax:  [phone]  Name: Karla Lee MRN: [id number] Date of Birth: Jan 07, 1951

## 2020-03-04 ENCOUNTER — Other Ambulatory Visit: Payer: Self-pay

## 2020-03-04 ENCOUNTER — Ambulatory Visit: Payer: Medicare Other | Attending: Orthopaedic Surgery

## 2020-03-04 DIAGNOSIS — G8929 Other chronic pain: Secondary | ICD-10-CM | POA: Diagnosis present

## 2020-03-04 DIAGNOSIS — R29898 Other symptoms and signs involving the musculoskeletal system: Secondary | ICD-10-CM | POA: Insufficient documentation

## 2020-03-04 DIAGNOSIS — M25512 Pain in left shoulder: Secondary | ICD-10-CM | POA: Insufficient documentation

## 2020-03-04 DIAGNOSIS — M25612 Stiffness of left shoulder, not elsewhere classified: Secondary | ICD-10-CM | POA: Insufficient documentation

## 2020-03-04 NOTE — Therapy (Signed)
[facility] [address]  [address] [place], [place], [address] Phone: [phone]   Fax:  [phone]  Physical Therapy Treatment  Patient Details  Name: Karla Lee MRN: [id number] Date of Birth: [birth year] Referring Provider (PT): Doneen Poisson, MD   Encounter Date: 03/04/2020   PT End of Session - 03/04/20 0939    Visit Number 2    Number of Visits 10    Date for PT Re-Evaluation 04/15/20    Authorization Type Medicare & BCBS    PT Start Time 0929    PT Stop Time 1025   ended visit with 10 min moist heat   PT Time Calculation (min) 56 min    Activity Tolerance Patient tolerated treatment well;Patient limited by pain    Behavior During Therapy [facility] for tasks assessed/performed           Past Medical History:  Diagnosis Date  . High cholesterol   . Osteopenia 01/2017   T score -2.3 FRAX 9.5%/1.4%    Past Surgical History:  Procedure Laterality Date  . ABDOMINAL HYSTERECTOMY  1999   SUPRACERVICAL HYSTERECTOMY   . CERVICAL BIOPSY  W/ LOOP ELECTRODE EXCISION     2003  . SHOULDER SURGERY  04/2014   arhroscopic    There were no vitals filed for this visit.   Subjective Assessment - 03/04/20 0934    Subjective Pt. noting no issues with HEP.    Pertinent History osteopenia, HLD, L shoulder arthroscopy 2015    Diagnostic tests 02/24/20 L shoulder xray: 3 views of the left shoulder show well located shoulder with no acute findings.  There is slight calcifications of the insertion of the rotator cuff.    Patient Stated Goals get rid pain and avoid surgery    Currently in Pain? No/denies    Pain Score 0-No pain   "pinch" pain up to 5-6/10 mid ROM with reaching.   Pain Location Shoulder    Pain Orientation Left                             [facility] Adult PT Treatment/Exercise - 03/04/20 0001      Self-Care   Self-Care Other Self-Care Comments    Other Self-Care Comments   Instruction in self-tennis ball massage to B rhomboids focusing in L on wall       Shoulder Exercises: Supine   External Rotation Left;AAROM;10 reps   heavy cues to avoid painful end ranges of motion    External Rotation Limitations wand with pillow under elbow   Improved tolerance from HEP performance at home without P   Flexion Left;10 reps;AAROM   heavy cues to avoid painful end ranges of motion    Flexion Limitations wand; 5" hold     ABduction Left;10 reps;AAROM   heavy cues to avoid painful end ranges of motion    ABduction Limitations wand scaption; 5" hold    required guidance for more scaption motion - improved tolera     Shoulder Exercises: Standing   Row Both;10 reps;Theraband;Strengthening    Theraband Level (Shoulder Row) Level 2 (Red)    Row Limitations cues for scapular retraction       Shoulder Exercises: Pulleys   Flexion 3 minutes    Flexion Limitations to tolerance - cues to avoid painful ROM    Scaption 3 minutes    Scaption Limitations to tolerance - cues to avoid painful ROM  Shoulder Exercises: ROM/Strengthening   Cybex Row 15 reps    Cybex Row Limitations 5# - low row      Moist Heat Therapy   Number Minutes Moist Heat 10 Minutes    Moist Heat Location --   thoracic spine      Manual Therapy   Manual Therapy Soft tissue mobilization    Manual therapy comments seated     Soft tissue mobilization STM to rhomboids focusing on L                   PT Education - 03/04/20 1200    Education Details HEP update    Person(s) Educated Patient    Methods Explanation;Demonstration;Verbal cues;Handout    Comprehension Verbalized understanding;Returned demonstration;Verbal cues required            PT Short Term Goals - 03/04/20 0940      PT SHORT TERM GOAL #1   Title Patient to be independent with initial HEP.    Time 2    Period Weeks    Status On-going    Target Date 03/11/20             PT Long Term Goals - 03/04/20 1000      PT  LONG TERM GOAL #1   Title Patient to be independent with advanced HEP.    Time 7    Period Weeks    Status On-going      PT LONG TERM GOAL #2   Title Patient to demonstrate L shoulder AROM/PROM WFL and without pain limiting.    Time 7    Period Weeks    Status On-going      PT LONG TERM GOAL #3   Title Patient to demonstrate L shoulder strength >/=4+/5.    Time 7    Period Weeks    Status On-going      PT LONG TERM GOAL #4   Title Patient to report 75% improvement in ability to reach behind the back.    Time 7    Period Weeks    Status On-going                 Plan - 03/04/20 1200    Clinical Impression Statement Dennie Bible doing ok however noted HEP irritated her shoulder pain.  Review of gentle AAROM shoulder HEP revealed pt. pushing through painful arc of motion with overpressure while using cane for ROM activities thus required heavy cueing with all HEP activities to "avoid painful range of motion".  Pt. able to verbalize/demo proper understanding of avoiding pain by end of therex today.  Did perform some periscapular machine row strengthening as pt. attending gym 3x/week and with questions regarding appropriate activities.  Pt. able to end session pain free in L shoulder however had some tightness in mid back which was addressed with moist heat application in hooklying with good relief noted following.  Will plan for further HEP review in coming visits as HEP updated with alternative supine wand AAROM positioning today for improved tolerance.    Comorbidities osteopenia, HLD, L shoulder arthroscopy 2015    Rehab Potential Good    PT Treatment/Interventions ADLs/Self Care Home Management;Cryotherapy;Electrical Stimulation;Iontophoresis 4mg /ml Dexamethasone;Moist Heat;Therapeutic exercise;Therapeutic activities;Functional mobility training;Ultrasound;Neuromuscular re-education;Patient/family education;Manual techniques;Vasopneumatic Device;Taping;Energy conservation;Dry  needling;Passive range of motion    PT Next Visit Plan shoulder FOTO; progress shoulder PROM/AROM to tolerance    Consulted and Agree with Plan of Care Patient           Patient  will benefit from skilled therapeutic intervention in order to improve the following deficits and impairments:  Hypomobility, Increased edema, Decreased activity tolerance, Decreased strength, Increased fascial restricitons, Impaired UE functional use, Pain, Increased muscle spasms, Improper body mechanics, Decreased range of motion, Impaired flexibility, Postural dysfunction  Visit Diagnosis: Chronic left shoulder pain  Stiffness of left shoulder, not elsewhere classified  Other symptoms and signs involving the musculoskeletal system     Problem List Patient Active Problem List   Diagnosis Date Noted  . History of cervical dysplasia 12/28/2016  . Left knee pain 12/26/2016  . Hyperlipidemia 12/10/2012  . Vaginal atrophy 12/10/2012  . Osteopenia 12/10/2012    Kermit Balo, PTA 03/04/20 12:13 PM   [facility] [address]  [address] [place], [place], [address] Phone: [phone]   Fax:  [phone]  Name: Coty Student MRN: [id number] Date of Birth: 12-08-1950

## 2020-03-09 ENCOUNTER — Ambulatory Visit: Payer: Medicare Other

## 2020-03-09 ENCOUNTER — Other Ambulatory Visit: Payer: Self-pay

## 2020-03-09 DIAGNOSIS — G8929 Other chronic pain: Secondary | ICD-10-CM

## 2020-03-09 DIAGNOSIS — M25512 Pain in left shoulder: Secondary | ICD-10-CM | POA: Diagnosis not present

## 2020-03-09 DIAGNOSIS — M25612 Stiffness of left shoulder, not elsewhere classified: Secondary | ICD-10-CM

## 2020-03-09 DIAGNOSIS — R29898 Other symptoms and signs involving the musculoskeletal system: Secondary | ICD-10-CM

## 2020-03-09 NOTE — Therapy (Signed)
[facility] [place] [address]  [place], [place], [address] Phone: [phone]   Fax:  [phone]  Physical Therapy Treatment  Patient Details  Name: Karla Lee MRN: [id number] Date of Birth: [birth year] Referring Provider (PT): Jean Rosenthal, MD   Encounter Date: 03/09/2020   PT End of Session - 03/09/20 1111    Visit Number 3    Number of Visits 10    Date for PT Re-Evaluation 04/15/20    Authorization Type Medicare & BCBS    PT Start Time 1104    PT Stop Time 1150    PT Time Calculation (min) 46 min    Activity Tolerance Patient tolerated treatment well;Patient limited by pain    Behavior During Therapy [facility] for tasks assessed/performed           Past Medical History:  Diagnosis Date  . High cholesterol   . Osteopenia 01/2017   T score -2.3 FRAX 9.5%/1.4%    Past Surgical History:  Procedure Laterality Date  . ABDOMINAL HYSTERECTOMY  1999   SUPRACERVICAL HYSTERECTOMY   . CERVICAL BIOPSY  W/ LOOP ELECTRODE EXCISION     2003  . SHOULDER SURGERY  04/2014   arhroscopic    There were no vitals filed for this visit.   Subjective Assessment - 03/09/20 1109    Subjective Pt. noting decline in her L shoulder over weekend.  Feels that her L shoulder pain pattern is consistent with her past experiences of frozen shoulder.    Pertinent History osteopenia, HLD, L shoulder arthroscopy 2015    Diagnostic tests 02/24/20 L shoulder xray: 3 views of the left shoulder show well located shoulder with no acute findings.  There is slight calcifications of the insertion of the rotator cuff.    Patient Stated Goals get rid pain and avoid surgery    Currently in Pain? Yes    Pain Score 6     Pain Location Shoulder    Pain Orientation Left    Pain Descriptors / Indicators Constant    Pain Type Chronic pain    Pain Frequency Intermittent    Aggravating Factors  overhead reaching    Multiple Pain  Sites No              [facility] PT Assessment - 03/09/20 0001      Observation/Other Assessments   Focus on Therapeutic Outcomes (FOTO)  Shoulder FOTO:  48% status (52% limitation)                          [facility] Adult PT Treatment/Exercise - 03/09/20 0001      Shoulder Exercises: Supine   External Rotation Left;AAROM;10 reps    External Rotation Limitations wand with pillow under elbow    Flexion Left;10 reps;AAROM    Flexion Limitations wand; 5" hold     ABduction Left;10 reps;AAROM    ABduction Limitations wand scaption; 5" hold       Shoulder Exercises: Standing   Row Both;10 reps;Theraband;Strengthening   heavy cueing required for scapular retraction/depression    Theraband Level (Shoulder Row) Level 2 (Red)    Row Limitations cues for scapular retraction       Shoulder Exercises: Pulleys   Flexion 3 minutes    Flexion Limitations to tolerance - cues to avoid painful ROM    Scaption 3 minutes    Scaption Limitations to tolerance - cues to avoid painful ROM  Shoulder Exercises: Therapy Ball   Flexion Right;10 reps    Flexion Limitations orange p-ball on wall                     PT Short Term Goals - 03/09/20 1133      PT SHORT TERM GOAL #1   Title Patient to be independent with initial HEP.    Time 2    Period Weeks    Status Achieved    Target Date 03/11/20             PT Long Term Goals - 03/04/20 1000      PT LONG TERM GOAL #1   Title Patient to be independent with advanced HEP.    Time 7    Period Weeks    Status On-going      PT LONG TERM GOAL #2   Title Patient to demonstrate L shoulder AROM/PROM WFL and without pain limiting.    Time 7    Period Weeks    Status On-going      PT LONG TERM GOAL #3   Title Patient to demonstrate L shoulder strength >/=4+/5.    Time 7    Period Weeks    Status On-going      PT LONG TERM GOAL #4   Title Patient to report 75% improvement in ability to reach behind the back.     Time 7    Period Weeks    Status On-going                 Plan - 03/09/20 1112    Clinical Impression Statement Pt. reporting no questions regarding initial HEP.  STG #1 met.  pt. does note increased L shoulder pain over weekend with typical reaching activities which she feels is consistent with frozen shoulder.  Pt. encouraged to continues daily HEP performance and reviewed latest HEP update in session today with cueing to avoid overpressure with cane AAROM activities to avoid painful end ROM.  Pt. able to demo improved technique with updated HEP to end session.    Comorbidities osteopenia, HLD, L shoulder arthroscopy 2015    Rehab Potential Good    PT Treatment/Interventions ADLs/Self Care Home Management;Cryotherapy;Electrical Stimulation;Iontophoresis 42m/ml Dexamethasone;Moist Heat;Therapeutic exercise;Therapeutic activities;Functional mobility training;Ultrasound;Neuromuscular re-education;Patient/family education;Manual techniques;Vasopneumatic Device;Taping;Energy conservation;Dry needling;Passive range of motion    PT Next Visit Plan Progress shoulder PROM/AROM to tolerance    Consulted and Agree with Plan of Care Patient           Patient will benefit from skilled therapeutic intervention in order to improve the following deficits and impairments:  Hypomobility, Increased edema, Decreased activity tolerance, Decreased strength, Increased fascial restricitons, Impaired UE functional use, Pain, Increased muscle spasms, Improper body mechanics, Decreased range of motion, Impaired flexibility, Postural dysfunction  Visit Diagnosis: Chronic left shoulder pain  Stiffness of left shoulder, not elsewhere classified  Other symptoms and signs involving the musculoskeletal system     Problem List Patient Active Problem List   Diagnosis Date Noted  . History of cervical dysplasia 12/28/2016  . Left knee pain 12/26/2016  . Hyperlipidemia 12/10/2012  . Vaginal atrophy  12/10/2012  . Osteopenia 12/10/2012    MBess Harvest PTA 03/09/20 12:00 PM   [facility] [place] [address]Phone: [phone]  Fax:  [phone] Name: Karla DapolitoMRN: [id number] of Birth: 1Jan 20, 1952

## 2020-03-11 ENCOUNTER — Ambulatory Visit: Payer: Medicare Other

## 2020-03-11 ENCOUNTER — Other Ambulatory Visit: Payer: Self-pay

## 2020-03-11 DIAGNOSIS — G8929 Other chronic pain: Secondary | ICD-10-CM

## 2020-03-11 DIAGNOSIS — M25512 Pain in left shoulder: Secondary | ICD-10-CM | POA: Diagnosis not present

## 2020-03-11 DIAGNOSIS — R29898 Other symptoms and signs involving the musculoskeletal system: Secondary | ICD-10-CM

## 2020-03-11 DIAGNOSIS — M25612 Stiffness of left shoulder, not elsewhere classified: Secondary | ICD-10-CM

## 2020-03-11 NOTE — Therapy (Signed)
[facility]patient [facility] [address]  [address] [place], [place], [address] Phone: [phone]   Fax:  [phone]  Physical Therapy Treatment  Patient Details  Name: Karla Lee MRN: [id number] Date of Birth: 08-15-1950 Referring Provider (PT): Doneen Poisson, MD   Encounter Date: 03/11/2020   PT End of Session - 03/11/20 1126    Visit Number 4    Number of Visits 10    Date for PT Re-Evaluation 04/15/20    Authorization Type Medicare & BCBS    PT Start Time 1105    PT Stop Time 1150    PT Time Calculation (min) 45 min    Activity Tolerance Patient tolerated treatment well;Patient limited by pain    Behavior During Therapy [name] for tasks assessed/performed           Past Medical History:  Diagnosis Date  . High cholesterol   . Osteopenia 01/2017   T score -2.3 FRAX 9.5%/1.4%    Past Surgical History:  Procedure Laterality Date  . ABDOMINAL HYSTERECTOMY  1999   SUPRACERVICAL HYSTERECTOMY   . CERVICAL BIOPSY  W/ LOOP ELECTRODE EXCISION     2003  . SHOULDER SURGERY  04/2014   arhroscopic    There were no vitals filed for this visit.   Subjective Assessment - 03/11/20 1111    Subjective Pt. reporting improved L shoulder comfort today.    Pertinent History osteopenia, HLD, L shoulder arthroscopy 2015    Diagnostic tests 02/24/20 L shoulder xray: 3 views of the left shoulder show well located shoulder with no acute findings.  There is slight calcifications of the insertion of the rotator cuff.    Patient Stated Goals get rid pain and avoid surgery    Currently in Pain? No/denies    Pain Score 0-No pain   L shoulder pain up to 6/10 burning pain   Pain Location Shoulder    Pain Orientation Left    Pain Descriptors / Indicators Burning    Pain Type Chronic pain    Pain Frequency Intermittent    Multiple Pain Sites No                             [facility] Adult PT Treatment/Exercise  - 03/11/20 0001      Shoulder Exercises: Supine   External Rotation Left;15 reps;AAROM    External Rotation Limitations wand with pillow under elbow    ABduction Left;15 reps;AAROM    ABduction Limitations wand scaption; 5" hold       Shoulder Exercises: Standing   External Rotation Both;10 reps;Theraband;Strengthening    Theraband Level (Shoulder External Rotation) Level 1 (Yellow)    External Rotation Limitations leaning on doorseal     Other Standing Exercises standing scapular retraction/depression leaning on doorseal 5" x 10   Manual correction for proper technique      Shoulder Exercises: Pulleys   Flexion 3 minutes    Flexion Limitations to tolerance     Scaption 3 minutes    Scaption Limitations to tolerance       Neck Exercises: Stretches   Upper Trapezius Stretch Right;Left;1 rep;30 seconds    Upper Trapezius Stretch Limitations B    Levator Stretch Right;Left;1 rep;30 seconds    Levator Stretch Limitations B    Neck Stretch 1 rep;30 seconds    Neck Stretch Limitations L scalenes stretch x 30 sec  PT Short Term Goals - 03/09/20 1133      PT SHORT TERM GOAL #1   Title Patient to be independent with initial HEP.    Time 2    Period Weeks    Status Achieved    Target Date 03/11/20             PT Long Term Goals - 03/04/20 1000      PT LONG TERM GOAL #1   Title Patient to be independent with advanced HEP.    Time 7    Period Weeks    Status On-going      PT LONG TERM GOAL #2   Title Patient to demonstrate L shoulder AROM/PROM WFL and without pain limiting.    Time 7    Period Weeks    Status On-going      PT LONG TERM GOAL #3   Title Patient to demonstrate L shoulder strength >/=4+/5.    Time 7    Period Weeks    Status On-going      PT LONG TERM GOAL #4   Title Patient to report 75% improvement in ability to reach behind the back.    Time 7    Period Weeks    Status On-going                 Plan -  03/11/20 1157    Clinical Impression Statement Karla Lee doing ok.  Notes improved shoulder comfort since last session.  Does note that she is avoiding painful end ranges of motion with ROM HEP activities consistently at home.  Progressed ROM and gentle RTC strengthening with good tolerance today.  Appears to have improved scaption/abduction, ER motion today however not formally measured.    Comorbidities osteopenia, HLD, L shoulder arthroscopy 2015    Rehab Potential Good    PT Treatment/Interventions ADLs/Self Care Home Management;Cryotherapy;Electrical Stimulation;Iontophoresis 4mg /ml Dexamethasone;Moist Heat;Therapeutic exercise;Therapeutic activities;Functional mobility training;Ultrasound;Neuromuscular re-education;Patient/family education;Manual techniques;Vasopneumatic Device;Taping;Energy conservation;Dry needling;Passive range of motion    PT Next Visit Plan Progress shoulder PROM/AROM to tolerance    Consulted and Agree with Plan of Care Patient           Patient will benefit from skilled therapeutic intervention in order to improve the following deficits and impairments:  Hypomobility, Increased edema, Decreased activity tolerance, Decreased strength, Increased fascial restricitons, Impaired UE functional use, Pain, Increased muscle spasms, Improper body mechanics, Decreased range of motion, Impaired flexibility, Postural dysfunction  Visit Diagnosis: Chronic left shoulder pain  Stiffness of left shoulder, not elsewhere classified  Other symptoms and signs involving the musculoskeletal system     Problem List Patient Active Problem List   Diagnosis Date Noted  . History of cervical dysplasia 12/28/2016  . Left knee pain 12/26/2016  . Hyperlipidemia 12/10/2012  . Vaginal atrophy 12/10/2012  . Osteopenia 12/10/2012    12/12/2012, PTA 03/11/20 12:09 PM   [facility] [address]  [address] [place], [place],  [place] Phone: [phone]   Fax:  [phone]  Name: Karla Lee MRN: [id number] Fu Date of Birth: 06/19/1950

## 2020-03-13 ENCOUNTER — Ambulatory Visit: Payer: Medicare Other | Admitting: Physical Therapy

## 2020-03-13 ENCOUNTER — Encounter: Payer: Self-pay | Admitting: Physical Therapy

## 2020-03-13 ENCOUNTER — Other Ambulatory Visit: Payer: Self-pay

## 2020-03-13 DIAGNOSIS — M25512 Pain in left shoulder: Secondary | ICD-10-CM

## 2020-03-13 DIAGNOSIS — G8929 Other chronic pain: Secondary | ICD-10-CM

## 2020-03-13 DIAGNOSIS — R29898 Other symptoms and signs involving the musculoskeletal system: Secondary | ICD-10-CM

## 2020-03-13 DIAGNOSIS — M25612 Stiffness of left shoulder, not elsewhere classified: Secondary | ICD-10-CM

## 2020-03-13 NOTE — Therapy (Signed)
[facility] Outpatient Rehabilitation [facility] [address]  [address] [place], [place], [address] Phone: [phone]   Fax:  [phone]  Physical Therapy Treatment  Patient Details  Name: Karla Lee MRN: [id number] Date of Birth: 1950/09/21 Referring Provider (PT): Doneen Poisson, MD   Encounter Date: 03/13/2020   PT End of Session - 03/13/20 1149    Visit Number 5    Number of Visits 10    Date for PT Re-Evaluation 04/15/20    Authorization Type Medicare & BCBS    PT Start Time 1100    PT Stop Time 1155    PT Time Calculation (min) 55 min    Activity Tolerance Patient tolerated treatment well;Patient limited by pain    Behavior During Therapy [facility] for tasks assessed/performed           Past Medical History:  Diagnosis Date  . High cholesterol   . Osteopenia 01/2017   T score -2.3 FRAX 9.5%/1.4%    Past Surgical History:  Procedure Laterality Date  . ABDOMINAL HYSTERECTOMY  1999   SUPRACERVICAL HYSTERECTOMY   . CERVICAL BIOPSY  W/ LOOP ELECTRODE EXCISION     2003  . SHOULDER SURGERY  04/2014   arhroscopic    There were no vitals filed for this visit.   Subjective Assessment - 03/13/20 1101    Subjective Notes that she has had a little set back as far as her R shoulder goes- feels like she has a muscle spasm in the neck. Suggests that this may be d/t working on exercises for her L shoulder.    Pertinent History osteopenia, HLD, L shoulder arthroscopy 2015    Diagnostic tests 02/24/20 L shoulder xray: 3 views of the left shoulder show well located shoulder with no acute findings.  There is slight calcifications of the insertion of the rotator cuff.    Patient Stated Goals get rid pain and avoid surgery    Currently in Pain? Yes    Pain Score 4     Pain Location Shoulder    Pain Orientation Left    Pain Descriptors / Indicators Burning    Pain Type Chronic pain    Multiple Pain Sites Yes    Pain Score 8    Pain  Location Shoulder    Pain Orientation Upper    Pain Descriptors / Indicators Spasm    Pain Type Acute pain              [facility] PT Assessment - 03/13/20 0001      PROM   Left Shoulder Internal Rotation 71 Degrees    Left Shoulder External Rotation 58 Degrees                         [facility] Adult PT Treatment/Exercise - 03/13/20 0001      Exercises   Exercises Shoulder      Shoulder Exercises: Supine   Other Supine Exercises L shoulder IR/ER with 1 # to tolerable end range      Shoulder Exercises: Sidelying   External Rotation AROM;Left;10 reps    External Rotation Weight (lbs) 1    External Rotation Limitations to tolerance   more discomfort into IR   ABduction Left;10 reps    ABduction Limitations thumb up; to tolerance      Shoulder Exercises: Pulleys   Flexion 3 minutes    Flexion Limitations to tolerance     Scaption 3 minutes  Scaption Limitations to tolerance       Manual Therapy   Manual Therapy Soft tissue mobilization;Myofascial release;Passive ROM;Joint mobilization    Manual therapy comments seated     Joint Mobilization L shoulder AP, PA, and distraction joint mobs grade III to tolerance    Soft tissue mobilization STM to R LS insertion, UT    Myofascial Release manual TPR to R LS insertion and UT    Passive ROM L shoulder PROM in all planes with traction for comfort to tolerance                  PT Education - 03/13/20 1148    Education Details advised patient to discontinue TB row to allow R LS pain to settle    Person(s) Educated Patient    Methods Explanation    Comprehension Verbalized understanding            PT Short Term Goals - 03/09/20 1133      PT SHORT TERM GOAL #1   Title Patient to be independent with initial HEP.    Time 2    Period Weeks    Status Achieved    Target Date 03/11/20             PT Long Term Goals - 03/04/20 1000      PT LONG TERM GOAL #1   Title Patient to be independent with  advanced HEP.    Time 7    Period Weeks    Status On-going      PT LONG TERM GOAL #2   Title Patient to demonstrate L shoulder AROM/PROM WFL and without pain limiting.    Time 7    Period Weeks    Status On-going      PT LONG TERM GOAL #3   Title Patient to demonstrate L shoulder strength >/=4+/5.    Time 7    Period Weeks    Status On-going      PT LONG TERM GOAL #4   Title Patient to report 75% improvement in ability to reach behind the back.    Time 7    Period Weeks    Status On-going                 Plan - 03/13/20 1149    Clinical Impression Statement Patient reporting new onset of R shoulder/neck pain and spasm since last session. Noting greater pain over this region rather than the L shoulder today, thus addressed this first to allow for improved exercise tolerance. Patient with palpable tender trigger point over R LS and UT- patient tolerated STM/TPR well. Demonstrated good tolerance for shoulder PROM and joint mobs, with patient demonstrating a good improvement in L shoulder ER after MT. Increased challenge with shoulder ROM using weight to encourage stretch- patient was encouraged to stop at the point of pain. Reviewed scapular rows which required a correction of form. Patient suggesting that this exercise resulted in her R sided pain, thus advised patient to discontinue this exercise at home to allow pain to settle. Patient reported understanding. Ended session with [employer] to L shoulder for pain relief. No complaints at end of session. Patient progressing well towards goals.    Comorbidities osteopenia, HLD, L shoulder arthroscopy 2015    Rehab Potential Good    PT Treatment/Interventions ADLs/Self Care Home Management;Cryotherapy;Electrical Stimulation;Iontophoresis 4mg /ml Dexamethasone;Moist Heat;Therapeutic exercise;Therapeutic activities;Functional mobility training;Ultrasound;Neuromuscular re-education;Patient/family education;Manual techniques;Vasopneumatic  Device;Taping;Energy conservation;Dry needling;Passive range of motion    PT Next Visit Plan Progress  shoulder PROM/AROM to tolerance    Consulted and Agree with Plan of Care Patient           Patient will benefit from skilled therapeutic intervention in order to improve the following deficits and impairments:  Hypomobility, Increased edema, Decreased activity tolerance, Decreased strength, Increased fascial restricitons, Impaired UE functional use, Pain, Increased muscle spasms, Improper body mechanics, Decreased range of motion, Impaired flexibility, Postural dysfunction  Visit Diagnosis: Chronic left shoulder pain  Stiffness of left shoulder, not elsewhere classified  Other symptoms and signs involving the musculoskeletal system     Problem List Patient Active Problem List   Diagnosis Date Noted  . History of cervical dysplasia 12/28/2016  . Left knee pain 12/26/2016  . Hyperlipidemia 12/10/2012  . Vaginal atrophy 12/10/2012  . Osteopenia 12/10/2012     Anette Guarneri, PT, DPT 03/13/20 11:58 AM   [facility] [address]  [address] [place], [place], [address] Phone: [phone]   Fax:  [phone]  Name: Guinevere Stephenson MRN: [id number] Date of Birth: 03-14-1951

## 2020-03-16 ENCOUNTER — Ambulatory Visit: Payer: Medicare Other

## 2020-03-16 ENCOUNTER — Other Ambulatory Visit: Payer: Self-pay

## 2020-03-16 DIAGNOSIS — M25512 Pain in left shoulder: Secondary | ICD-10-CM

## 2020-03-16 DIAGNOSIS — G8929 Other chronic pain: Secondary | ICD-10-CM

## 2020-03-16 DIAGNOSIS — M25612 Stiffness of left shoulder, not elsewhere classified: Secondary | ICD-10-CM

## 2020-03-16 DIAGNOSIS — R29898 Other symptoms and signs involving the musculoskeletal system: Secondary | ICD-10-CM

## 2020-03-16 NOTE — Therapy (Signed)
[facility] Outpatient Rehabilitation [facility] [address]  [address] [place], [place], [address] Phone: [phone]   Fax:  [phone]  Physical Therapy Treatment  Patient Details  Name: Karla Lee MRN: [id number] Date of Birth: June 10, 1950 Referring Provider (PT): Doneen Poisson, MD   Encounter Date: 03/16/2020   PT End of Session - 03/16/20 1106    Visit Number 6    Number of Visits 10    Date for PT Re-Evaluation 04/15/20    Authorization Type Medicare & BCBS    PT Start Time 1100    PT Stop Time 1142    PT Time Calculation (min) 42 min    Activity Tolerance Patient tolerated treatment well;Patient limited by pain    Behavior During Therapy [facility] for tasks assessed/performed           Past Medical History:  Diagnosis Date  . High cholesterol   . Osteopenia 01/2017   T score -2.3 FRAX 9.5%/1.4%    Past Surgical History:  Procedure Laterality Date  . ABDOMINAL HYSTERECTOMY  1999   SUPRACERVICAL HYSTERECTOMY   . CERVICAL BIOPSY  W/ LOOP ELECTRODE EXCISION     2003  . SHOULDER SURGERY  04/2014   arhroscopic    There were no vitals filed for this visit.   Subjective Assessment - 03/16/20 1105    Subjective Doing ok.    Pertinent History osteopenia, HLD, L shoulder arthroscopy 2015    Diagnostic tests 02/24/20 L shoulder xray: 3 views of the left shoulder show well located shoulder with no acute findings.  There is slight calcifications of the insertion of the rotator cuff.    Patient Stated Goals get rid pain and avoid surgery    Currently in Pain? No/denies    Pain Score 0-No pain    Pain Location Shoulder    Pain Orientation Left                             [facility] Adult PT Treatment/Exercise - 03/16/20 0001      Shoulder Exercises: Sidelying   External Rotation AROM;Left;15 reps    External Rotation Weight (lbs) 1    External Rotation Limitations to tolerance    ABduction Left;15 reps;AROM     ABduction Limitations thumb up;tolerance      Shoulder Exercises: Pulleys   Flexion 3 minutes    Flexion Limitations to tolerance     Scaption 3 minutes    Scaption Limitations to tolerance       Shoulder Exercises: Stretch   Internal Rotation Stretch Limitations 5" x 10 with wand behind back     Other Shoulder Stretches B shoulder hyperextension stretch with cane behind back 5" x 10       Manual Therapy   Manual Therapy Soft tissue mobilization;Myofascial release;Scapular mobilization    Manual therapy comments prone     Soft tissue mobilization DTM to L UT, LS, rhomboids, posterior shoulder musculature     Myofascial Release Manual TPR to L UT, LS    Scapular Mobilization L scapular mobs all direction - limited                     PT Short Term Goals - 03/09/20 1133      PT SHORT TERM GOAL #1   Title Patient to be independent with initial HEP.    Time 2    Period Weeks    Status Achieved  Target Date 03/11/20             PT Long Term Goals - 03/04/20 1000      PT LONG TERM GOAL #1   Title Patient to be independent with advanced HEP.    Time 7    Period Weeks    Status On-going      PT LONG TERM GOAL #2   Title Patient to demonstrate L shoulder AROM/PROM WFL and without pain limiting.    Time 7    Period Weeks    Status On-going      PT LONG TERM GOAL #3   Title Patient to demonstrate L shoulder strength >/=4+/5.    Time 7    Period Weeks    Status On-going      PT LONG TERM GOAL #4   Title Patient to report 75% improvement in ability to reach behind the back.    Time 7    Period Weeks    Status On-going                 Plan - 03/16/20 1116    Clinical Impression Statement Pat primary complaint was L periscapular muscular tenderness/tightness and noted good benefit from MT last session.  Session focused on MT for improved periscapular tissue quality to carryover in promoting improved scapular mechanics with overhead/behind back  reaching motions.  Pat leaving session noting improved L shoulder comfort and will consider further MT in future visits.    Comorbidities osteopenia, HLD, L shoulder arthroscopy 2015    Rehab Potential Good    PT Treatment/Interventions ADLs/Self Care Home Management;Cryotherapy;Electrical Stimulation;Iontophoresis 4mg /ml Dexamethasone;Moist Heat;Therapeutic exercise;Therapeutic activities;Functional mobility training;Ultrasound;Neuromuscular re-education;Patient/family education;Manual techniques;Vasopneumatic Device;Taping;Energy conservation;Dry needling;Passive range of motion    PT Next Visit Plan Progress shoulder PROM/AROM to tolerance    Consulted and Agree with Plan of Care Patient           Patient will benefit from skilled therapeutic intervention in order to improve the following deficits and impairments:  Hypomobility, Increased edema, Decreased activity tolerance, Decreased strength, Increased fascial restricitons, Impaired UE functional use, Pain, Increased muscle spasms, Improper body mechanics, Decreased range of motion, Impaired flexibility, Postural dysfunction  Visit Diagnosis: Chronic left shoulder pain  Stiffness of left shoulder, not elsewhere classified  Other symptoms and signs involving the musculoskeletal system     Problem List Patient Active Problem List   Diagnosis Date Noted  . History of cervical dysplasia 12/28/2016  . Left knee pain 12/26/2016  . Hyperlipidemia 12/10/2012  . Vaginal atrophy 12/10/2012  . Osteopenia 12/10/2012    12/12/2012, PTA 03/16/20 11:57 AM   [facility] [address]  [address] [place], [place], [place] Phone: [phone]   Fax:  [phone]  Name: Karla Lee MRN: [id number] Fu Date of Birth: Sep 05, 1950

## 2020-03-18 ENCOUNTER — Other Ambulatory Visit: Payer: Self-pay

## 2020-03-18 ENCOUNTER — Ambulatory Visit: Payer: Medicare Other

## 2020-03-18 DIAGNOSIS — G8929 Other chronic pain: Secondary | ICD-10-CM

## 2020-03-18 DIAGNOSIS — M25512 Pain in left shoulder: Secondary | ICD-10-CM

## 2020-03-18 DIAGNOSIS — M25612 Stiffness of left shoulder, not elsewhere classified: Secondary | ICD-10-CM

## 2020-03-18 DIAGNOSIS — R29898 Other symptoms and signs involving the musculoskeletal system: Secondary | ICD-10-CM

## 2020-03-18 NOTE — Therapy (Signed)
[facility]patient [facility] [address]  [address] [place], [place], [address] Phone: [phone]   Fax:  [phone]  Physical Therapy Treatment  Patient Details  Name: Karla Lee MRN: [id number] Date of Birth: [birth year] Referring Provider (PT): Doneen Poisson, MD   Encounter Date: 03/18/2020   PT End of Session - 03/18/20 1405    Visit Number 7    Number of Visits 10    Date for PT Re-Evaluation 04/15/20    Authorization Type Medicare & BCBS    PT Start Time 1359    PT Stop Time 1444    PT Time Calculation (min) 45 min    Activity Tolerance Patient tolerated treatment well;Patient limited by pain    Behavior During Therapy [facility] for tasks assessed/performed           Past Medical History:  Diagnosis Date  . High cholesterol   . Osteopenia 01/2017   T score -2.3 FRAX 9.5%/1.4%    Past Surgical History:  Procedure Laterality Date  . ABDOMINAL HYSTERECTOMY  1999   SUPRACERVICAL HYSTERECTOMY   . CERVICAL BIOPSY  W/ LOOP ELECTRODE EXCISION     2003  . SHOULDER SURGERY  04/2014   arhroscopic    There were no vitals filed for this visit.   Subjective Assessment - 03/18/20 1403    Subjective Pt. noting nearly pain free today.    Pertinent History osteopenia, HLD, L shoulder arthroscopy 2015    Diagnostic tests 02/24/20 L shoulder xray: 3 views of the left shoulder show well located shoulder with no acute findings.  There is slight calcifications of the insertion of the rotator cuff.    Patient Stated Goals get rid pain and avoid surgery    Currently in Pain? No/denies    Pain Score 0-No pain    Pain Location Shoulder    Pain Orientation Left    Multiple Pain Sites No              [facility] PT Assessment - 03/18/20 0001      AROM   Right/Left Shoulder Left    Left Shoulder Flexion 139 Degrees    Left Shoulder ABduction 149 Degrees    Left Shoulder External Rotation --   FER to C7      Standardized Balance Assessment   Standardized Balance Assessment --                         [facility] Adult PT Treatment/Exercise - 03/18/20 0001      Shoulder Exercises: Prone   Flexion Both;10 reps;Strengthening    Flexion Limitations Prone "Y's" on green p-ball     Extension Both;10 reps;Strengthening    Extension Limitations prone "I's" on green p-ball     Horizontal ABduction 1 Both;10 reps;Strengthening    Horizontal ABduction 1 Limitations Prone "T's" on green p-ball       Shoulder Exercises: Sidelying   External Rotation Left;Weights;5 reps   2 sets    External Rotation Weight (lbs) 2    External Rotation Limitations to tolerance    ABduction Left;10 reps;Weights    ABduction Weight (lbs) 2     ABduction Limitations thumb up;tolerance      Shoulder Exercises: Standing   External Rotation Left;10 reps;Theraband;Strengthening    Flexion Left;10 reps;Strengthening;Weights    Shoulder Flexion Weight (lbs) 2    Flexion Limitations to 2nd shelf       Shoulder Exercises: ROM/Strengthening  UBE (Upper Arm Bike) Lvl 1.5, 3 min forwards/3 min backwards       Shoulder Exercises: Stretch   Other Shoulder Stretches L lats stretch seated with green p-ball rollouts x 2 x 20 sec       Manual Therapy   Manual Therapy Soft tissue mobilization;Myofascial release    Soft tissue mobilization STM to L deltoid complex    Myofascial Release TPR to L lateral deltoid              Patient Education: Updated HEP handout including band resisted IR, ER, AAROM wand flexion, abduction standing. Pt. Verbalized understanding         PT Short Term Goals - 03/09/20 1133      PT SHORT TERM GOAL #1   Title Patient to be independent with initial HEP.    Time 2    Period Weeks    Status Achieved    Target Date 03/11/20             PT Long Term Goals - 03/04/20 1000      PT LONG TERM GOAL #1   Title Patient to be independent with advanced HEP.    Time 7    Period  Weeks    Status On-going      PT LONG TERM GOAL #2   Title Patient to demonstrate L shoulder AROM/PROM WFL and without pain limiting.    Time 7    Period Weeks    Status On-going      PT LONG TERM GOAL #3   Title Patient to demonstrate L shoulder strength >/=4+/5.    Time 7    Period Weeks    Status On-going      PT LONG TERM GOAL #4   Title Patient to report 75% improvement in ability to reach behind the back.    Time 7    Period Weeks    Status On-going                 Plan - 03/18/20 1406    Clinical Impression Statement Pam doing ok.  Able to demonstrate somewhat improved scapulohumeral rhythm with overhead reaching with less UT substitution evident by improving AROM flexion (see flowsheet).  Progressing toward LTG #2.  Still noting most L shoulder discomfort with reaching behind back and overhead.  Does report daily adherence to HEP.  HEP updated to progress to [facility] gravity resisted overhead motions with cane.     Comorbidities osteopenia, HLD, L shoulder arthroscopy 2015    Rehab Potential Good    PT Treatment/Interventions ADLs/Self Care Home Management;Cryotherapy;Electrical Stimulation;Iontophoresis 4mg /ml Dexamethasone;Moist Heat;Therapeutic exercise;Therapeutic activities;Functional mobility training;Ultrasound;Neuromuscular re-education;Patient/family education;Manual techniques;Vasopneumatic Device;Taping;Energy conservation;Dry needling;Passive range of motion    PT Next Visit Plan Progress shoulder PROM/AROM to tolerance    Consulted and Agree with Plan of Care Patient           Patient will benefit from skilled therapeutic intervention in order to improve the following deficits and impairments:  Hypomobility, Increased edema, Decreased activity tolerance, Decreased strength, Increased fascial restricitons, Impaired UE functional use, Pain, Increased muscle spasms, Improper body mechanics, Decreased range of motion, Impaired flexibility, Postural  dysfunction  Visit Diagnosis: Chronic left shoulder pain  Stiffness of left shoulder, not elsewhere classified  Other symptoms and signs involving the musculoskeletal system     Problem List Patient Active Problem List   Diagnosis Date Noted  . History of cervical dysplasia 12/28/2016  . Left knee pain 12/26/2016  . Hyperlipidemia 12/10/2012  .  Vaginal atrophy 12/10/2012  . Osteopenia 12/10/2012    Kermit Balo, PTA 03/18/20 6:45 PM   [facility] Out[facility] Rehabilitation [facility] [address]  [address] [place], [place], [address] Phone: [phone]   Fax:  [phone]  Name: Dottie Vaquerano MRN: [id number] Date of Birth: [birth year]

## 2020-03-20 ENCOUNTER — Ambulatory Visit: Payer: Medicare Other

## 2020-03-20 ENCOUNTER — Other Ambulatory Visit: Payer: Self-pay

## 2020-03-20 DIAGNOSIS — R29898 Other symptoms and signs involving the musculoskeletal system: Secondary | ICD-10-CM

## 2020-03-20 DIAGNOSIS — G8929 Other chronic pain: Secondary | ICD-10-CM

## 2020-03-20 DIAGNOSIS — M25512 Pain in left shoulder: Secondary | ICD-10-CM | POA: Diagnosis not present

## 2020-03-20 DIAGNOSIS — M25612 Stiffness of left shoulder, not elsewhere classified: Secondary | ICD-10-CM

## 2020-03-20 NOTE — Therapy (Signed)
[facility] [address]  [address] [place], [place], [address] Phone: [phone]   Fax:  [phone]  Physical Therapy Treatment  Patient Details  Name: Karla Lee MRN: [id number] Date of Birth: 1950-09-15 Referring Provider (PT): Doneen Poisson, MD   Encounter Date: 03/20/2020   PT End of Session - 03/20/20 1116    Visit Number 8    Number of Visits 10    Date for PT Re-Evaluation 04/15/20    Authorization Type Medicare & BCBS    PT Start Time 1103    PT Stop Time 1158    PT Time Calculation (min) 55 min    Activity Tolerance Patient tolerated treatment well;Patient limited by pain    Behavior During Therapy [facility] for tasks assessed/performed           Past Medical History:  Diagnosis Date  . High cholesterol   . Osteopenia 01/2017   T score -2.3 FRAX 9.5%/1.4%    Past Surgical History:  Procedure Laterality Date  . ABDOMINAL HYSTERECTOMY  1999   SUPRACERVICAL HYSTERECTOMY   . CERVICAL BIOPSY  W/ LOOP ELECTRODE EXCISION     2003  . SHOULDER SURGERY  04/2014   arhroscopic    There were no vitals filed for this visit.   Subjective Assessment - 03/20/20 1107    Subjective Pt. noting some L shoulder irritation "setback" after performing band strengthening activities from HEP updated issued last visit.    Pertinent History osteopenia, HLD, L shoulder arthroscopy 2015    Diagnostic tests 02/24/20 L shoulder xray: 3 views of the left shoulder show well located shoulder with no acute findings.  There is slight calcifications of the insertion of the rotator cuff.    Patient Stated Goals get rid pain and avoid surgery    Currently in Pain? Yes    Pain Score 5    up to 8/10 at worst   Pain Location Shoulder    Pain Orientation Left    Pain Descriptors / Indicators Burning    Pain Type Chronic pain    Pain Frequency Intermittent                             [facility] Adult  PT Treatment/Exercise - 03/20/20 0001      Shoulder Exercises: Sidelying   External Rotation Left;AROM;10 reps    External Rotation Limitations to tolerance    ABduction Left;15 reps    ABduction Limitations thumb up;tolerance      Shoulder Exercises: Standing   External Rotation Left;10 reps;Theraband;Strengthening    Theraband Level (Shoulder External Rotation) Level 1 (Yellow)    External Rotation Limitations poor tolerance with limited understanding of avoiding substitutions thus terminated and pt. instructed to stop this exercise at home       Shoulder Exercises: ROM/Strengthening   UBE (Upper Arm Bike) Lvl 1.5, 3 min forwards/3 min backwards       Modalities   Modalities Vasopneumatic      Vasopneumatic   Number Minutes Vasopneumatic  10 minutes    Vasopnuematic Location  Shoulder   L   Vasopneumatic Pressure Low    Vasopneumatic Temperature  coldest temp.       Manual Therapy   Manual Therapy Soft tissue mobilization;Myofascial release    Soft tissue mobilization L lateral deltoid, L shoulder complex, L UT    Myofascial Release TPR to L lateral dletoid and L UT  limited response                  PT Education - 03/20/20 1205    Education Details HEP update;  sidelying ER (to substitue for ER/IR band exercises)    Person(s) Educated Patient    Methods Explanation;Demonstration;Verbal cues;Handout    Comprehension Verbalized understanding;Returned demonstration;Verbal cues required            PT Short Term Goals - 03/09/20 1133      PT SHORT TERM GOAL #1   Title Patient to be independent with initial HEP.    Time 2    Period Weeks    Status Achieved    Target Date 03/11/20             PT Long Term Goals - 03/04/20 1000      PT LONG TERM GOAL #1   Title Patient to be independent with advanced HEP.    Time 7    Period Weeks    Status On-going      PT LONG TERM GOAL #2   Title Patient to demonstrate L shoulder AROM/PROM WFL and without pain  limiting.    Time 7    Period Weeks    Status On-going      PT LONG TERM GOAL #3   Title Patient to demonstrate L shoulder strength >/=4+/5.    Time 7    Period Weeks    Status On-going      PT LONG TERM GOAL #4   Title Patient to report 75% improvement in ability to reach behind the back.    Time 7    Period Weeks    Status On-going                 Plan - 03/20/20 1155    Clinical Impression Statement Karla Lee doing ok.  Feels she has had a flare-up of L shoulder pain since performing HEP band resisted IR, ER.  Pt. unable to demo good technique with standing band ER, IR thus terminated this exercise in session and instructed pt. to stop this activity at home and HEP updated with sidelying ER substitute which was tolerated well.  MT addressing muscular tension/TP in L lateral deltoid and L UT with limited response thus may consider DN in future visits per pt. discussion.  Pt. issued DN educational handout to end session along with trial of ice/compression for reduction in L shoulder pain.    Comorbidities osteopenia, HLD, L shoulder arthroscopy 2015    Rehab Potential Good    PT Treatment/Interventions ADLs/Self Care Home Management;Cryotherapy;Electrical Stimulation;Iontophoresis 4mg /ml Dexamethasone;Moist Heat;Therapeutic exercise;Therapeutic activities;Functional mobility training;Ultrasound;Neuromuscular re-education;Patient/family education;Manual techniques;Vasopneumatic Device;Taping;Energy conservation;Dry needling;Passive range of motion    PT Next Visit Plan Progress shoulder PROM/AROM to tolerance    Consulted and Agree with Plan of Care Patient           Patient will benefit from skilled therapeutic intervention in order to improve the following deficits and impairments:  Hypomobility, Increased edema, Decreased activity tolerance, Decreased strength, Increased fascial restricitons, Impaired UE functional use, Pain, Increased muscle spasms, Improper body mechanics,  Decreased range of motion, Impaired flexibility, Postural dysfunction  Visit Diagnosis: Chronic left shoulder pain  Stiffness of left shoulder, not elsewhere classified  Other symptoms and signs involving the musculoskeletal system     Problem List Patient Active Problem List   Diagnosis Date Noted  . History of cervical dysplasia 12/28/2016  . Left knee pain 12/26/2016  . Hyperlipidemia 12/10/2012  .  Vaginal atrophy 12/10/2012  . Osteopenia 12/10/2012    Kermit Balo, PTA 03/20/20 12:10 PM   [facility] [address]  [address] [place], [place], [address] Phone: [phone]   Fax:  [phone]  Name: Karla Lee MRN: [id number] Date of Birth: Jun 28, 1950

## 2020-03-20 NOTE — Patient Instructions (Addendum)

## 2020-03-23 ENCOUNTER — Ambulatory Visit: Payer: Medicare Other

## 2020-03-24 ENCOUNTER — Ambulatory Visit: Payer: Medicare Other | Admitting: Orthopaedic Surgery

## 2020-03-25 ENCOUNTER — Encounter: Payer: Medicare Other | Admitting: Physical Therapy

## 2020-03-27 ENCOUNTER — Ambulatory Visit: Payer: Medicare Other | Admitting: Physical Therapy

## 2020-03-27 ENCOUNTER — Other Ambulatory Visit: Payer: Self-pay

## 2020-03-27 ENCOUNTER — Encounter: Payer: Self-pay | Admitting: Physical Therapy

## 2020-03-27 DIAGNOSIS — R29898 Other symptoms and signs involving the musculoskeletal system: Secondary | ICD-10-CM

## 2020-03-27 DIAGNOSIS — G8929 Other chronic pain: Secondary | ICD-10-CM

## 2020-03-27 DIAGNOSIS — M25612 Stiffness of left shoulder, not elsewhere classified: Secondary | ICD-10-CM

## 2020-03-27 DIAGNOSIS — M25512 Pain in left shoulder: Secondary | ICD-10-CM | POA: Diagnosis not present

## 2020-03-27 NOTE — Therapy (Signed)
[facility] [address]  [place], [place], [address] Phone: [phone]   Fax:  [phone]  Physical Therapy Treatment  Patient Details  Name: Karla Lee MRN: [id number] Date of Birth: 07-11-1950 Referring Provider (PT): Jean Rosenthal, MD   Progress Note Reporting Period 02/26/20 to 03/27/20  See note below for Objective Data and Assessment of Progress/Goals.     Encounter Date: 03/27/2020   PT End of Session - 03/27/20 1146    Visit Number 9    Number of Visits 17    Date for PT Re-Evaluation 04/24/20    Authorization Type Medicare & BCBS    PT Start Time 1056    PT Stop Time 1152    PT Time Calculation (min) 56 min    Activity Tolerance Patient tolerated treatment well;Patient limited by pain    Behavior During Therapy [facility] for tasks assessed/performed           Past Medical History:  Diagnosis Date   High cholesterol    Osteopenia 01/2017   T score -2.3 FRAX 9.5%/1.4%    Past Surgical History:  Procedure Laterality Date   ABDOMINAL HYSTERECTOMY  1999   SUPRACERVICAL HYSTERECTOMY    CERVICAL BIOPSY  W/ LOOP ELECTRODE EXCISION     2003   SHOULDER SURGERY  04/2014   arhroscopic    There were no vitals filed for this visit.   Subjective Assessment - 03/27/20 1056    Subjective Shoulder has been pretty much the same as she had to miss some visits d/t her husband getting COVID. Reporting no issues with HEP. Notes 50% improvement in the L shoulder thus far as she feels she if better able to stretch her arm.    Pertinent History osteopenia, HLD, L shoulder arthroscopy 2015    Diagnostic tests 02/24/20 L shoulder xray: 3 views of the left shoulder show well located shoulder with no acute findings.  There is slight calcifications of the insertion of the rotator cuff.    Patient Stated Goals get rid pain and avoid surgery    Currently in Pain? Yes    Pain Score 4      Pain Location Shoulder    Pain Orientation Left    Pain Descriptors / Indicators --   pulling   Pain Type Chronic pain              [facility] PT Assessment - 03/27/20 0001      Assessment   Medical Diagnosis Chronic L shoulder pain    Referring Provider (PT) Jean Rosenthal, MD    Onset Date/Surgical Date 09/07/19      Observation/Other Assessments   Focus on Therapeutic Outcomes (FOTO)  Shoulder FOTO:  64% status (36% limitation, 34% predicted))       AROM   Left Shoulder Flexion 143 Degrees    Left Shoulder ABduction 151 Degrees    Left Shoulder Internal Rotation --   FIR L3   Left Shoulder External Rotation --   FER T1     PROM   Left Shoulder Flexion 152 Degrees    Left Shoulder ABduction 163 Degrees    Left Shoulder Internal Rotation 81 Degrees    Left Shoulder External Rotation 56 Degrees      Strength   Left Shoulder Flexion 4+/5    Left Shoulder ABduction 4+/5    Left Shoulder Internal Rotation 4+/5    Left Shoulder External Rotation 4+/5  [place] Adult PT Treatment/Exercise - 03/27/20 0001      Shoulder Exercises: Sidelying   ABduction AROM;Left;10 reps    ABduction Limitations thumb up; to tolerance      Shoulder Exercises: Standing   Other Standing Exercises shoulder ER stretch at doorway 5x5" to tolerance    Other Standing Exercises shoulder extension AAROM with wand 10x to tolerance; R shoulder IR AAROM with wand 5x      Shoulder Exercises: Pulleys   Flexion 3 minutes    Flexion Limitations to tolerance     Scaption 3 minutes    Scaption Limitations to tolerance       Shoulder Exercises: Stretch   Other Shoulder Stretches L lats stretch with green pball and against wall to tolerance- noting better stretch at wall      Vasopneumatic   Number Minutes Vasopneumatic  10 minutes    Vasopnuematic Location  Shoulder   L   Vasopneumatic Pressure Low    Vasopneumatic Temperature  coldest temp.       Manual  Therapy   Manual Therapy Passive ROM    Manual therapy comments supine    Joint Mobilization L shoulder AP, PA, and distraction joint mobs grade III to tolerance    Passive ROM L shoulder PROM in all planes including extension                  PT Education - 03/27/20 1145    Education Details discussion on objective progress and remaining impairments; HEP update with cues to avoid pushing into excessive pain    Person(s) Educated Patient    Methods Explanation;Demonstration;Tactile cues;Verbal cues;Handout    Comprehension Verbalized understanding;Returned demonstration            PT Short Term Goals - 03/27/20 1058      PT SHORT TERM GOAL #1   Title Patient to be independent with initial HEP.    Time 2    Period Weeks    Status Achieved    Target Date 03/11/20             PT Long Term Goals - 03/27/20 1058      PT LONG TERM GOAL #1   Title Patient to be independent with advanced HEP.    Time 4    Period Weeks    Status Partially Met   met for current   Target Date 04/24/20      PT LONG TERM GOAL #2   Title Patient to demonstrate L shoulder AROM/PROM [facility] and without pain limiting.    Time 4    Period Weeks    Status Partially Met    Target Date 04/24/20      PT LONG TERM GOAL #3   Title Patient to demonstrate L shoulder strength >/=4+/5.    Time 7    Period Weeks    Status Achieved      PT LONG TERM GOAL #4   Title Patient to report 75% improvement in ability to reach behind the back.    Time 4    Period Weeks    Status On-going   notes no better   Target Date 04/24/20                 Plan - 03/27/20 1155    Clinical Impression Statement Patient reports 50% improvement in the L shoulder thus far as she feels she if better able to stretch her arm. However, still having considerable difficulty reaching behind the back. Reports  compliance with HEP and denies issues with HEP. L shoulder PROM has improved in flexion, abduction, and IR, while  AROM has improved in flexion and abduction. Patient seems to be limited at end0-range motions d/t pain. Thus may benefit from additional pain-management options. Strength goal met today. Worked on updating HEP to better-address patients remaining limitations in shoulder IR and ER. Patient reported good tolerance and understanding for new HEP exercises. Ended session with [employer] to L shoulder for post-exercise soreness. No complaints at end of session. Patient is progressing well towards goals. Would benefit from additional skilled PT services 2x/week for 4 weeks to address remaining impairments.    Comorbidities osteopenia, HLD, L shoulder arthroscopy 2015    Rehab Potential Good    PT Treatment/Interventions ADLs/Self Care Home Management;Cryotherapy;Electrical Stimulation;Iontophoresis 41m/ml Dexamethasone;Moist Heat;Therapeutic exercise;Therapeutic activities;Functional mobility training;Ultrasound;Neuromuscular re-education;Patient/family education;Manual techniques;Vasopneumatic Device;Taping;Energy conservation;Dry needling;Passive range of motion    PT Next Visit Plan Progress shoulder PROM/AROM to increase IR, ER, abduction; STM/stretching to lats    Consulted and Agree with Plan of Care Patient           Patient will benefit from skilled therapeutic intervention in order to improve the following deficits and impairments:  Hypomobility, Increased edema, Decreased activity tolerance, Decreased strength, Increased fascial restricitons, Impaired UE functional use, Pain, Increased muscle spasms, Improper body mechanics, Decreased range of motion, Impaired flexibility, Postural dysfunction  Visit Diagnosis: Chronic left shoulder pain  Stiffness of left shoulder, not elsewhere classified  Other symptoms and signs involving the musculoskeletal system     Problem List Patient Active Problem List   Diagnosis Date Noted   History of cervical dysplasia 12/28/2016   Left knee pain  12/26/2016   Hyperlipidemia 12/10/2012   Vaginal atrophy 12/10/2012   Osteopenia 12/10/2012     YJanene Harvey PT, DPT 03/27/20 12:13 PM   [facility] [address] [place] [address]: [phone]  Fax:  [phone] Name: PJessicaann OverbaughMRN: [id number] of Birth: [birth year]

## 2020-03-27 NOTE — Addendum Note (Signed)
Addended by: Anette Guarneri D on: 03/27/2020 12:14 PM   Modules accepted: Orders

## 2020-04-01 ENCOUNTER — Ambulatory Visit (INDEPENDENT_AMBULATORY_CARE_PROVIDER_SITE_OTHER): Payer: Medicare Other | Admitting: Orthopaedic Surgery

## 2020-04-01 ENCOUNTER — Other Ambulatory Visit: Payer: Self-pay

## 2020-04-01 ENCOUNTER — Encounter: Payer: Self-pay | Admitting: Orthopaedic Surgery

## 2020-04-01 DIAGNOSIS — M25512 Pain in left shoulder: Secondary | ICD-10-CM

## 2020-04-01 DIAGNOSIS — G8929 Other chronic pain: Secondary | ICD-10-CM

## 2020-04-01 DIAGNOSIS — Z96612 Presence of left artificial shoulder joint: Secondary | ICD-10-CM

## 2020-04-01 NOTE — Progress Notes (Signed)
The patient comes in for continued evaluation treatment of left shoulder pain.  She has been doing a lot of physical therapy visits to help with increasing her shoulder motion.  I also provided a steroid injection subacromial space.  She is worked on activity modification and taken anti-inflammatories.  She uses a home exercise program as well.  She also reports some trigger point type of pain and this is just left of the midline of thoracic spine about midway the scapular border.  This area is definitely tender on my exam.  She still has limitations with internal rotation with adduction of the left shoulder.  There is still signs of impingement as well.  I do feel it is appropriate to obtain an MRI of her left shoulder at this standpoint given the failed conservative treatment.  She will continue to go to physical therapy.  I want them to try dry needling on her trigger point in the thoracic area just to the left of the midline.  I did provide a trigger point injection in this area with lidocaine and a steroid to see if this would help.  All question concerns were answered addressed.  I will see her back in about 2 weeks to hopefully go over the MRI of her left shoulder which at this point I feel is medically warranted.

## 2020-04-06 ENCOUNTER — Ambulatory Visit: Payer: Medicare Other | Attending: Orthopaedic Surgery

## 2020-04-06 ENCOUNTER — Other Ambulatory Visit: Payer: Self-pay

## 2020-04-06 DIAGNOSIS — G8929 Other chronic pain: Secondary | ICD-10-CM | POA: Diagnosis present

## 2020-04-06 DIAGNOSIS — M25512 Pain in left shoulder: Secondary | ICD-10-CM | POA: Diagnosis not present

## 2020-04-06 DIAGNOSIS — M25612 Stiffness of left shoulder, not elsewhere classified: Secondary | ICD-10-CM | POA: Diagnosis present

## 2020-04-06 DIAGNOSIS — R29898 Other symptoms and signs involving the musculoskeletal system: Secondary | ICD-10-CM | POA: Diagnosis present

## 2020-04-06 NOTE — Therapy (Signed)
[facility] [address]  [place], [place], [address] Phone: [phone]   Fax:  [phone]  Physical Therapy Treatment  Patient Details  Name: Karla Lee MRN: [id number] Date of Birth: July 26, 1950 Referring Provider (PT): Jean Rosenthal, MD   Encounter Date: 04/06/2020   PT End of Session - 04/06/20 1114    Visit Number 10    Number of Visits 17    Date for PT Re-Evaluation 04/24/20    Authorization Type Medicare & BCBS    PT Start Time 1103    PT Stop Time 1148    PT Time Calculation (min) 45 min    Activity Tolerance Patient tolerated treatment well;Patient limited by pain    Behavior During Therapy [facility] for tasks assessed/performed           Past Medical History:  Diagnosis Date  . High cholesterol   . Osteopenia 01/2017   T score -2.3 FRAX 9.5%/1.4%    Past Surgical History:  Procedure Laterality Date  . ABDOMINAL HYSTERECTOMY  1999   SUPRACERVICAL HYSTERECTOMY   . CERVICAL BIOPSY  W/ LOOP ELECTRODE EXCISION     2003  . SHOULDER SURGERY  04/2014   arhroscopic    There were no vitals filed for this visit.   Subjective Assessment - 04/06/20 1107    Subjective Pt. noting she saw MD who gave her a lidocaine injection in tender area on L medial scapula.    Pertinent History osteopenia, HLD, L shoulder arthroscopy 2015    Diagnostic tests 02/24/20 L shoulder xray: 3 views of the left shoulder show well located shoulder with no acute findings.  There is slight calcifications of the insertion of the rotator cuff.    Patient Stated Goals get rid pain and avoid surgery    Currently in Pain? No/denies    Pain Score 0-No pain    Pain Location Shoulder    Pain Orientation Left    Pain Descriptors / Indicators --   "pulling"   Pain Type Chronic pain    Pain Frequency Intermittent    Multiple Pain Sites No                             [facility] Adult PT  Treatment/Exercise - 04/06/20 0001      Shoulder Exercises: Supine   Other Supine Exercises Hooklying horizontal abduction with red TB laying on 6" foam roll x 10    Other Supine Exercises Hooklying "W" B shld ER with red TB x 10 hooklying on 6" foam roll      Shoulder Exercises: Sidelying   External Rotation Left;10 reps;Weights    External Rotation Weight (lbs) 2    External Rotation Limitations to tolerance    Other Sidelying Exercises B "open book" stretch 5" x 10      Shoulder Exercises: Therapy Ball   Flexion Right;10 reps    Flexion Limitations orange p-ball on wall       Shoulder Exercises: ROM/Strengthening   UBE (Upper Arm Bike) Lvl 2.5, 3 min forwards/3 min backwards       Manual Therapy   Manual Therapy Soft tissue mobilization    Manual therapy comments prone     Soft tissue mobilization L rhomboids       Neck Exercises: Stretches   Chest Stretch 2 reps;60 seconds   Hooklying on 6" foam roll   Other Neck Stretches Rhomboids stretch 2  x 30 sec                   PT Education - 04/06/20 1211    Education Details HEP update    Person(s) Educated Patient    Methods Explanation;Demonstration;Verbal cues;Handout    Comprehension Verbalized understanding;Returned demonstration;Verbal cues required            PT Short Term Goals - 03/27/20 1058      PT SHORT TERM GOAL #1   Title Patient to be independent with initial HEP.    Time 2    Period Weeks    Status Achieved    Target Date 03/11/20             PT Long Term Goals - 03/27/20 1058      PT LONG TERM GOAL #1   Title Patient to be independent with advanced HEP.    Time 4    Period Weeks    Status Partially Met   met for current   Target Date 04/24/20      PT LONG TERM GOAL #2   Title Patient to demonstrate L shoulder AROM/PROM [facility] and without pain limiting.    Time 4    Period Weeks    Status Partially Met    Target Date 04/24/20      PT LONG TERM GOAL #3   Title Patient to  demonstrate L shoulder strength >/=4+/5.    Time 7    Period Weeks    Status Achieved      PT LONG TERM GOAL #4   Title Patient to report 75% improvement in ability to reach behind the back.    Time 4    Period Weeks    Status On-going   notes no better   Target Date 04/24/20                 Plan - 04/06/20 1117    Clinical Impression Statement Pt. reports MD wants her to have MRI scheduled on 04/23/20.  Did receive a lidocaine injection in her L-sided rhomboids/trapezius at MD visit which has somewhat helped.  MD wanting her to pursue DN in future with PT to address upper back TPs.  Progressed scapular strengthening and anterior chest stretching without issue.  Was able to demonstrate improved FIR behind back today.  Will consider DN in future sessions.    Comorbidities osteopenia, HLD, L shoulder arthroscopy 2015    Rehab Potential Good    PT Treatment/Interventions ADLs/Self Care Home Management;Cryotherapy;Electrical Stimulation;Iontophoresis 68m/ml Dexamethasone;Moist Heat;Therapeutic exercise;Therapeutic activities;Functional mobility training;Ultrasound;Neuromuscular re-education;Patient/family education;Manual techniques;Vasopneumatic Device;Taping;Energy conservation;Dry needling;Passive range of motion    PT Next Visit Plan Progress shoulder PROM/AROM to increase IR, ER, abduction; STM/stretching to lats           Patient will benefit from skilled therapeutic intervention in order to improve the following deficits and impairments:  Hypomobility, Increased edema, Decreased activity tolerance, Decreased strength, Increased fascial restricitons, Impaired UE functional use, Pain, Increased muscle spasms, Improper body mechanics, Decreased range of motion, Impaired flexibility, Postural dysfunction  Visit Diagnosis: Chronic left shoulder pain  Stiffness of left shoulder, not elsewhere classified  Other symptoms and signs involving the musculoskeletal  system     Problem List Patient Active Problem List   Diagnosis Date Noted  . History of cervical dysplasia 12/28/2016  . Left knee pain 12/26/2016  . Hyperlipidemia 12/10/2012  . Vaginal atrophy 12/10/2012  . Osteopenia 12/10/2012    MBess Harvest PTA 04/06/20 12:12 PM  [facility] [address]  [place], [place], [address] Phone: [phone]   Fax:  [phone]  Name: Karla Lee MRN: [id number] Date of Birth: 10-03-1950

## 2020-04-13 ENCOUNTER — Encounter: Payer: Medicare Other | Admitting: Physical Therapy

## 2020-04-15 ENCOUNTER — Encounter: Payer: Self-pay | Admitting: Physical Therapy

## 2020-04-15 ENCOUNTER — Ambulatory Visit: Payer: Medicare Other | Admitting: Orthopaedic Surgery

## 2020-04-15 ENCOUNTER — Other Ambulatory Visit: Payer: Self-pay

## 2020-04-15 ENCOUNTER — Ambulatory Visit: Payer: Medicare Other | Admitting: Physical Therapy

## 2020-04-15 DIAGNOSIS — G8929 Other chronic pain: Secondary | ICD-10-CM

## 2020-04-15 DIAGNOSIS — M25612 Stiffness of left shoulder, not elsewhere classified: Secondary | ICD-10-CM

## 2020-04-15 DIAGNOSIS — M25512 Pain in left shoulder: Secondary | ICD-10-CM

## 2020-04-15 DIAGNOSIS — R29898 Other symptoms and signs involving the musculoskeletal system: Secondary | ICD-10-CM

## 2020-04-15 NOTE — Therapy (Addendum)
[facility] [address]  [place], [place], [address] Phone: [phone]   Fax:  [phone]  Physical Therapy Treatment  Patient Details  Name: Karla Lee MRN: [id number] Date of Birth: 01-08-1951 Referring Provider (PT): Jean Rosenthal, MD   Progress Note Reporting Period 04/06/20 to 04/15/20  See note below for Objective Data and Assessment of Progress/Goals.     Encounter Date: 04/15/2020   PT End of Session - 04/15/20 0937    Visit Number 11    Number of Visits 17    Date for PT Re-Evaluation 04/24/20    Authorization Type Medicare & BCBS    PT Start Time [phone]    PT Stop Time 1034    PT Time Calculation (min) 57 min    Activity Tolerance Patient tolerated treatment well;Patient limited by pain    Behavior During Therapy [facility] for tasks assessed/performed           Past Medical History:  Diagnosis Date  . High cholesterol   . Osteopenia 01/2017   T score -2.3 FRAX 9.5%/1.4%    Past Surgical History:  Procedure Laterality Date  . ABDOMINAL HYSTERECTOMY  1999   SUPRACERVICAL HYSTERECTOMY   . CERVICAL BIOPSY  W/ LOOP ELECTRODE EXCISION     2003  . SHOULDER SURGERY  04/2014   arhroscopic    There were no vitals filed for this visit.   Subjective Assessment - 04/15/20 0940    Subjective Pt expressing interest in DN for TP in upper back near spine.    Pertinent History osteopenia, HLD, L shoulder arthroscopy 2015    Diagnostic tests 02/24/20 L shoulder xray: 3 views of the left shoulder show well located shoulder with no acute findings.  There is slight calcifications of the insertion of the rotator cuff.    Patient Stated Goals get rid pain and avoid surgery    Currently in Pain? No/denies                             [facility] Adult PT Treatment/Exercise - 04/15/20 0937      Shoulder Exercises: ROM/Strengthening   Nustep L4 x 6 min (UE/LE)       Modalities   Modalities Electrical Stimulation;Moist Heat      Moist Heat Therapy   Number Minutes Moist Heat 15 Minutes    Moist Heat Location Cervical;Shoulder      Electrical Stimulation   Electrical Stimulation Location B UT/LS    Electrical Stimulation Action IFC    Electrical Stimulation Parameters 80-150 Hz, intensity to pt tolerance x 15'    Electrical Stimulation Goals Pain;Tone      Manual Therapy   Manual Therapy Soft tissue mobilization;Myofascial release    Manual therapy comments skilled palpation and monitoring during DN    Soft tissue mobilization STM/DTM to B UT, LS and rhomboids    Myofascial Release manual TPR to B UT & LS            Trigger Point Dry Needling - 04/15/20 0937    Consent Given? Yes    Education Handout Provided Yes    Muscles Treated Head and Neck Upper trapezius;Levator scapulae   Bilateral   Upper Trapezius Response Twitch reponse elicited;Palpable increased muscle length    Levator Scapulae Response Twitch response elicited;Palpable increased muscle length   very strong twitch response on L  PT Education - 04/15/20 [phone]    Education Details DN rational, procedures, outcomes, potential side effects, and recommended post-treatment exercises/activity    Person(s) Educated Patient    Methods Explanation;Handout    Comprehension Verbalized understanding            PT Short Term Goals - 03/27/20 1058      PT SHORT TERM GOAL #1   Title Patient to be independent with initial HEP.    Time 2    Period Weeks    Status Achieved    Target Date 03/11/20             PT Long Term Goals - 03/27/20 1058      PT LONG TERM GOAL #1   Title Patient to be independent with advanced HEP.    Time 4    Period Weeks    Status Partially Met   met for current   Target Date 04/24/20      PT LONG TERM GOAL #2   Title Patient to demonstrate L shoulder AROM/PROM [facility] and without pain limiting.    Time 4    Period Weeks     Status Partially Met    Target Date 04/24/20      PT LONG TERM GOAL #3   Title Patient to demonstrate L shoulder strength >/=4+/5.    Time 7    Period Weeks    Status Achieved      PT LONG TERM GOAL #4   Title Patient to report 75% improvement in ability to reach behind the back.    Time 4    Period Weeks    Status On-going   notes no better   Target Date 04/24/20                 Plan - 04/15/20 0942    Clinical Impression Statement Karla Lee reports her MD recommended trial of DN to address ongoing TPs in her upper back. She denies pain today but notes pain tends to move around through upper shoulders and periscapular muscles with greatest muscle tension and ttp noted in R UT and L LS today. After explanation of DN rational, procedures, outcomes and potential side effects, patient verbalized consent to DN treatment in conjunction with manual STM/DTM and TPR to reduce ttp/muscle tension. Muscles treated include B UT and LS. DN produced normal response with good twitches elicited resulting in palpable reduction in pain/ttp and muscle tension. Pt educated to expect mild to moderate muscle soreness for up to 24-48 hrs and instructed to continue prescribed home exercise program and current activity level with pt verbalizing understanding of theses instructions. Session concluded with estim and moist heat to B UT and LS to promote further muscle relaxation/relief from muscle spasm.    Comorbidities osteopenia, HLD, L shoulder arthroscopy 2015    Rehab Potential Good    PT Treatment/Interventions ADLs/Self Care Home Management;Cryotherapy;Electrical Stimulation;Iontophoresis 19m/ml Dexamethasone;Moist Heat;Therapeutic exercise;Therapeutic activities;Functional mobility training;Ultrasound;Neuromuscular re-education;Patient/family education;Manual techniques;Vasopneumatic Device;Taping;Energy conservation;Dry needling;Passive range of motion    PT Next Visit Plan Assess resposne to DN; Progress  shoulder PROM/AROM to increase IR, ER, abduction; STM/stretching to lats           Patient will benefit from skilled therapeutic intervention in order to improve the following deficits and impairments:  Hypomobility, Increased edema, Decreased activity tolerance, Decreased strength, Increased fascial restricitons, Impaired UE functional use, Pain, Increased muscle spasms, Improper body mechanics, Decreased range of motion, Impaired flexibility, Postural dysfunction  Visit Diagnosis: Chronic left shoulder  pain  Stiffness of left shoulder, not elsewhere classified  Other symptoms and signs involving the musculoskeletal system     Problem List Patient Active Problem List   Diagnosis Date Noted  . History of cervical dysplasia 12/28/2016  . Left knee pain 12/26/2016  . Hyperlipidemia 12/10/2012  . Vaginal atrophy 12/10/2012  . Osteopenia 12/10/2012    Percival Spanish, PT, MPT 04/15/2020, 11:02 AM  [facility] [address]  [place], [place], [address] Phone: [phone]   Fax:  [phone]  Name: Karla Lee [facility] MRN: [id number] Date of Birth: Jan 26, 1951   PHYSICAL THERAPY DISCHARGE SUMMARY  Visits from Start of Care: 11  Current functional level related to goals / functional outcomes: Unable to assess; patient did not return   Remaining deficits: Shoulder pain and limited ROM   Education / Equipment: HEP  Plan: Patient agrees to discharge.  Patient goals were not met. Patient is being discharged due to not returning since the last visit.  ?????     Janene Harvey, PT, DPT 06/01/20 9:18 AM

## 2020-04-15 NOTE — Patient Instructions (Signed)

## 2020-04-21 ENCOUNTER — Other Ambulatory Visit: Payer: Medicare Other

## 2020-04-21 ENCOUNTER — Ambulatory Visit
Admission: RE | Admit: 2020-04-21 | Discharge: 2020-04-21 | Disposition: A | Discharge status: Home / Self Care | Payer: Medicare Other | Source: Ambulatory Visit | Attending: Orthopaedic Surgery | Admitting: Orthopaedic Surgery

## 2020-04-21 DIAGNOSIS — Z96612 Presence of left artificial shoulder joint: Secondary | ICD-10-CM

## 2020-04-28 ENCOUNTER — Ambulatory Visit: Payer: Self-pay

## 2020-04-28 ENCOUNTER — Ambulatory Visit (INDEPENDENT_AMBULATORY_CARE_PROVIDER_SITE_OTHER): Payer: Medicare Other | Admitting: Orthopaedic Surgery

## 2020-04-28 ENCOUNTER — Encounter: Payer: Self-pay | Admitting: Orthopaedic Surgery

## 2020-04-28 DIAGNOSIS — M25512 Pain in left shoulder: Secondary | ICD-10-CM

## 2020-04-28 DIAGNOSIS — M7502 Adhesive capsulitis of left shoulder: Secondary | ICD-10-CM

## 2020-04-28 DIAGNOSIS — G8929 Other chronic pain: Secondary | ICD-10-CM

## 2020-04-28 NOTE — Progress Notes (Signed)
The patient comes in today to go over MRI of her left shoulder.  She has been dealing with left shoulder pain and stiffness for some time.  I had tried a steroid injection in her left shoulder subacromial space.  She is also been through physical therapy.  Most of her pain is deep in the shoulder joint and in the axillary region.  Examination of her left shoulder today shows her external rotation is almost full her forward flexion and abduction are getting close to full.  Her internal rotation with adduction is still to the lower lumbar spine.  The MRI of her shoulder on the left side shows an intact rotator cuff with no tendinosis and no fluid collection.  She does have thickening of the capsule suggesting adhesive capsulitis but there is only mild arthritic changes of the [facility] joint and no arthritic changes of the glenohumeral joint.  She would definitely benefit from an intra-articular steroid injection in that left shoulder joint.  We will see if we can get a consultation with Dr. Prince Rome to provide a left shoulder intra-articular steroid injection.  The patient agrees with this treatment plan as well.  All questions concerns were answered and addressed.  I would then see her back myself in about 4 weeks after this injection.

## 2020-04-28 NOTE — Progress Notes (Signed)
Subjective: Patient is here for ultrasound-guided intra-articular left glenohumeral injection.  Frozen shoulder s/p getting J&J covid vaccine in April.  Objective:  Limited IR.  Procedure: Ultrasound guided injection is preferred based studies that show increased duration, increased effect, greater accuracy, decreased procedural pain, increased response rate, and decreased cost with ultrasound guided versus blind injection.   Verbal informed consent obtained.  Time-out conducted.  Noted no overlying erythema, induration, or other signs of local infection. Ultrasound-guided left glenohumeral injection: After sterile prep with Betadine, injected 4 cc 1% lidocaine without epinephrine and 40 mg methylprednisolone using a 22-gauge spinal needle, passing the needle from posterior approach into the glenohumeral joint.  Injectate seen filling joint capsule.

## 2020-05-26 ENCOUNTER — Ambulatory Visit: Payer: Medicare Other | Admitting: Orthopaedic Surgery

## 2020-05-27 ENCOUNTER — Ambulatory Visit (INDEPENDENT_AMBULATORY_CARE_PROVIDER_SITE_OTHER): Payer: Medicare Other | Admitting: Orthopaedic Surgery

## 2020-05-27 ENCOUNTER — Encounter: Payer: Self-pay | Admitting: Orthopaedic Surgery

## 2020-05-27 DIAGNOSIS — M25512 Pain in left shoulder: Secondary | ICD-10-CM | POA: Diagnosis not present

## 2020-05-27 DIAGNOSIS — M7502 Adhesive capsulitis of left shoulder: Secondary | ICD-10-CM | POA: Diagnosis not present

## 2020-05-27 DIAGNOSIS — G8929 Other chronic pain: Secondary | ICD-10-CM

## 2020-05-27 NOTE — Progress Notes (Signed)
The patient has been dealing with significant adhesive capsulitis of her left shoulder as well as subacromial and subdeltoid bursitis.  At her last visit Dr. Prince Rome provided an intra-articular steroid injection of the left shoulder joint and she has done very well with that.  Her motion is doing better.  She still having some pain in the subacromial outlet area but overall is made good progress.  On exam her range of motion with forward flexion and abduction on the left shoulder is improved as well as internal rotation with adduction.  I will have her try Voltaren gel for the subdeltoid area of her left shoulder.  I explained the rationale behind this.  She will continue her home exercise program and I would like to see her back in 6 weeks to even consider repeat injections then if needed.  All questions and concerns were answered and addressed.

## 2020-07-08 ENCOUNTER — Encounter: Payer: Self-pay | Admitting: Orthopaedic Surgery

## 2020-07-08 ENCOUNTER — Ambulatory Visit: Payer: Medicare Other | Admitting: Orthopaedic Surgery

## 2020-07-08 DIAGNOSIS — M7502 Adhesive capsulitis of left shoulder: Secondary | ICD-10-CM

## 2020-07-08 DIAGNOSIS — G8929 Other chronic pain: Secondary | ICD-10-CM

## 2020-07-08 DIAGNOSIS — M25512 Pain in left shoulder: Secondary | ICD-10-CM | POA: Diagnosis not present

## 2020-07-08 MED ORDER — METHYLPREDNISOLONE ACETATE 40 MG/ML IJ SUSP
40.0000 mg | INTRAMUSCULAR | Status: AC | PRN
  Administered 2020-07-08: 40 mg via INTRA_ARTICULAR

## 2020-07-08 MED ORDER — LIDOCAINE HCL 1 % IJ SOLN
3.0000 mL | INTRAMUSCULAR | Status: AC | PRN
  Administered 2020-07-08: 3 mL

## 2020-07-08 NOTE — Progress Notes (Signed)
Office Visit Note   Patient: Karla Lee [facility]           Date of Birth: Jan 30, 1951           MRN: [id number] Visit Date: 07/08/2020              Requested by: Tracey Harries, MD [address] [place],  [place] [address] PCP: Tracey Harries, MD   Assessment & Plan: Visit Diagnoses:  1. Chronic left shoulder pain   2. Adhesive capsulitis of left shoulder     Plan: I did feel it was worth trying 1 more steroid injection in the left shoulder subacromial outlet space and she agreed to this and tolerated it well. At this point since she is doing well follow-up can be as needed. My neck step for her would be an arthroscopic intervention if she does not get significant improvement.  Follow-Up Instructions: Return if symptoms worsen or fail to improve.   Orders:  Orders Placed This Encounter  Procedures  . Large Joint Inj   No orders of the defined types were placed in this encounter.     Procedures: Large Joint Inj: L subacromial bursa on 07/08/2020 11:48 AM Indications: pain and diagnostic evaluation Details: 22 G 1.5 in needle  Arthrogram: No  Medications: 3 mL lidocaine 1 %; 40 mg methylPREDNISolone acetate 40 MG/ML Outcome: tolerated well, no immediate complications Procedure, treatment alternatives, risks and benefits explained, specific risks discussed. Consent was given by the patient. Immediately prior to procedure a time out was called to verify the correct patient, procedure, equipment, support staff and site/side marked as required. Patient was prepped and draped in the usual sterile fashion.       Clinical Data: No additional findings.   Subjective: Chief Complaint  Patient presents with  . Left Shoulder - Follow-up  I have been following Karla Lee for some time now for her left shoulder. 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 She is still having some shoulder pain but her motion has been improving  significantly. She has had an intra-articular steroid injection back in November and that left shoulder joint and she is also had a subacromial steroid injection. Both of these were in the fall of last year. She is also been through therapy. She does report improved motion of that shoulder but still pain.  HPI  Review of Systems She currently denies a headache, chest pain, shortness of breath, fever, chills, nausea, vomiting  Objective: Vital Signs: There were no vitals taken for this visit.  Physical Exam She is alert and orient x3 and in no acute distress Ortho Exam Examination of her left shoulder does show significant improvements with range of motion in all planes but still lacking just full residual and plane motion. There is still subacromial outlet pain but the rotator cuff is strong. Specialty Comments:  No specialty comments available.  Imaging: No results found.   PMFS History: Patient Active Problem List   Diagnosis Date Noted  . History of cervical dysplasia 12/28/2016  . Left knee pain 12/26/2016  . Hyperlipidemia 12/10/2012  . Vaginal atrophy 12/10/2012  . Osteopenia 12/10/2012   Past Medical History:  Diagnosis Date  . High cholesterol   . Osteopenia 01/2017   T score -2.3 FRAX 9.5%/1.4%    Family History  Problem Relation Age of Onset  . Heart disease Father     Past Surgical History:  Procedure Laterality Date  . ABDOMINAL HYSTERECTOMY  1999  SUPRACERVICAL HYSTERECTOMY   . CERVICAL BIOPSY  W/ LOOP ELECTRODE EXCISION     2003  . SHOULDER SURGERY  04/2014   arhroscopic   Social History   Occupational History  . Not on file  Tobacco Use  . Smoking status: Never Smoker  . Smokeless tobacco: Never Used  Vaping Use  . Vaping Use: Never used  Substance and Sexual Activity  . Alcohol use: Yes    Comment: WINE  . Drug use: No  . Sexual activity: Yes    Birth control/protection: Post-menopausal

## 2020-12-25 ENCOUNTER — Other Ambulatory Visit: Payer: Self-pay | Admitting: Family Medicine

## 2020-12-25 DIAGNOSIS — Z1231 Encounter for screening mammogram for malignant neoplasm of breast: Secondary | ICD-10-CM

## 2021-01-20 ENCOUNTER — Ambulatory Visit: Payer: Self-pay | Admitting: Obstetrics & Gynecology

## 2021-02-16 ENCOUNTER — Other Ambulatory Visit: Payer: Self-pay

## 2021-02-16 ENCOUNTER — Ambulatory Visit
Admission: RE | Admit: 2021-02-16 | Discharge: 2021-02-16 | Disposition: A | Discharge status: Home / Self Care | Payer: Medicare Other | Source: Ambulatory Visit | Attending: Family Medicine | Admitting: Family Medicine

## 2021-02-16 DIAGNOSIS — Z1231 Encounter for screening mammogram for malignant neoplasm of breast: Secondary | ICD-10-CM

## 2021-04-08 ENCOUNTER — Other Ambulatory Visit: Payer: Self-pay

## 2021-04-08 ENCOUNTER — Ambulatory Visit (INDEPENDENT_AMBULATORY_CARE_PROVIDER_SITE_OTHER): Payer: Medicare Other | Admitting: Obstetrics & Gynecology

## 2021-04-08 ENCOUNTER — Encounter: Payer: Self-pay | Admitting: Obstetrics & Gynecology

## 2021-04-08 ENCOUNTER — Other Ambulatory Visit (HOSPITAL_COMMUNITY)
Admission: RE | Admit: 2021-04-08 | Discharge: 2021-04-08 | Disposition: A | Discharge status: Home / Self Care | Payer: Medicare Other | Source: Ambulatory Visit | Attending: Obstetrics & Gynecology | Admitting: Obstetrics & Gynecology

## 2021-04-08 VITALS — BP 110/64 | HR 74 | Resp 16 | Ht 64.75 in | Wt 150.0 lb

## 2021-04-08 DIAGNOSIS — Z9189 Other specified personal risk factors, not elsewhere classified: Secondary | ICD-10-CM | POA: Insufficient documentation

## 2021-04-08 DIAGNOSIS — Z780 Asymptomatic menopausal state: Secondary | ICD-10-CM

## 2021-04-08 DIAGNOSIS — M8589 Other specified disorders of bone density and structure, multiple sites: Secondary | ICD-10-CM

## 2021-04-08 DIAGNOSIS — Z01419 Encounter for gynecological examination (general) (routine) without abnormal findings: Secondary | ICD-10-CM

## 2021-04-08 DIAGNOSIS — Z87410 Personal history of cervical dysplasia: Secondary | ICD-10-CM | POA: Diagnosis not present

## 2021-04-08 NOTE — Progress Notes (Signed)
Karla Lee [facility] [birth year] [id number]   History:    70 y.o. . L4Y5K3T4 Married. Has a total of 9 grand-children.   RP:  Established patient presenting for annual gyn exam    HPI: S/P Supracervical Hysterectomy.  Postmenopause, well on no HRT.  No PMB. Remote h/o LEEP.  Pap neg 12/2018.  No pelvic pain.  No pain with IC.  Urine/BMs wnl.  BMI 25.15.  Goes to the gym 3x/week.  Taking Vit D/Ca++.  BD 01/2019  Osteopenia with T-Score -2.4 at the [facility].  Breasts normal.  Screening mammo 01/2021 Neg.  Fasting labs with Fam MD.  [name] 2021.    Past medical history,surgical history, family history and social history were all reviewed and documented in the EPIC chart.  Gynecologic History No LMP recorded. Patient has had a hysterectomy.  Obstetric History OB History  Gravida Para Term Preterm AB Living  4 2     2 2   SAB IAB Ectopic Multiple Live Births  2            # Outcome Date GA Lbr Len/2nd Weight Sex Delivery Anes PTL Lv  4 SAB           3 SAB           2 Para           1 Para              ROS: A ROS was performed and pertinent positives and negatives are included in the history.  GENERAL: No fevers or chills. HEENT: No change in vision, no earache, sore throat or sinus congestion. NECK: No pain or stiffness. CARDIOVASCULAR: No chest pain or pressure. No palpitations. PULMONARY: No shortness of breath, cough or wheeze. GASTROINTESTINAL: No abdominal pain, nausea, vomiting or diarrhea, melena or bright red blood per rectum. GENITOURINARY: No urinary frequency, urgency, hesitancy or dysuria. MUSCULOSKELETAL: No joint or muscle pain, no back pain, no recent trauma. DERMATOLOGIC: No rash, no itching, no lesions. ENDOCRINE: No polyuria, polydipsia, no heat or cold intolerance. No recent change in weight. HEMATOLOGICAL: No anemia or easy bruising or bleeding. NEUROLOGIC: No headache, seizures, numbness, tingling or weakness. PSYCHIATRIC: No depression, no loss of interest in  normal activity or change in sleep pattern.     Exam:   BP 110/64   Pulse 74   Resp 16   Ht 5' 4.75" (1.645 m)   Wt 150 lb (68 kg)   BMI 25.15 kg/m   Body mass index is 25.15 kg/m.  General appearance : Well developed well nourished female. No acute distress HEENT: Eyes: no retinal hemorrhage or exudates,  Neck supple, trachea midline, no carotid bruits, no thyroidmegaly Lungs: Clear to auscultation, no rhonchi or wheezes, or rib retractions  Heart: Regular rate and rhythm, no murmurs or gallops Breast:Examined in sitting and supine position were symmetrical in appearance, no palpable masses or tenderness,  no skin retraction, no nipple inversion, no nipple discharge, no skin discoloration, no axillary or supraclavicular lymphadenopathy Abdomen: no palpable masses or tenderness, no rebound or guarding Extremities: no edema or skin discoloration or tenderness  Pelvic: Vulva: Normal             Vagina: No gross lesions or discharge  Cervix: No gross lesions or discharge.  Pap reflex done.  Uterus Absent  Adnexa  Without masses or tenderness  Anus: Normal   Assessment/Plan:  70 y.o. female for annual exam   1. Wellness Gyn Exam at risk  for cancer S/P Supracervical Hysterectomy.  Postmenopause, well on no HRT.  No PMB. Remote h/o LEEP.  Pap neg 12/2018.  Pap reflex done today.  No pelvic pain.  No pain with IC.  Urine/BMs wnl.  BMI 25.15.  Goes to the gym 3x/week.  Taking Vit D/Ca++.  BD 01/2019  Osteopenia with T-Score -2.4 at the [facility].  Bone Density scheduled here now.  Breasts normal.  Screening mammo 01/2021 Neg.  Fasting labs with Fam MD.  [name] 2021. - Cytology - PAP( [place])  2. History of cervical dysplasia - Cytology - PAP( [place])  3. Postmenopause Well on no HRT. - DG Bone Density; Future  4. Osteopenia of multiple sites Taking Vit D/Ca++.  BD 01/2019  Osteopenia with T-Score -2.4 at the [facility].  Bone Density scheduled here now.   - DG Bone Density;  Future  Other orders - MAGNESIUM PO; Take 250 mg by mouth.   Genia Del MD, 11:23 AM 04/08/2021

## 2021-04-09 LAB — CYTOLOGY - PAP: Diagnosis: NEGATIVE

## 2021-04-14 ENCOUNTER — Other Ambulatory Visit: Payer: Self-pay | Admitting: Obstetrics & Gynecology

## 2021-04-14 ENCOUNTER — Ambulatory Visit (INDEPENDENT_AMBULATORY_CARE_PROVIDER_SITE_OTHER): Payer: Medicare Other

## 2021-04-14 ENCOUNTER — Other Ambulatory Visit: Payer: Self-pay

## 2021-04-14 DIAGNOSIS — Z780 Asymptomatic menopausal state: Secondary | ICD-10-CM

## 2021-04-14 DIAGNOSIS — M810 Age-related osteoporosis without current pathological fracture: Secondary | ICD-10-CM

## 2021-04-14 DIAGNOSIS — M8589 Other specified disorders of bone density and structure, multiple sites: Secondary | ICD-10-CM

## 2021-04-27 ENCOUNTER — Ambulatory Visit (INDEPENDENT_AMBULATORY_CARE_PROVIDER_SITE_OTHER): Payer: Medicare Other | Admitting: Obstetrics & Gynecology

## 2021-04-27 ENCOUNTER — Encounter: Payer: Self-pay | Admitting: Obstetrics & Gynecology

## 2021-04-27 ENCOUNTER — Other Ambulatory Visit: Payer: Self-pay

## 2021-04-27 VITALS — BP 110/72

## 2021-04-27 DIAGNOSIS — M810 Age-related osteoporosis without current pathological fracture: Secondary | ICD-10-CM | POA: Diagnosis not present

## 2021-04-27 DIAGNOSIS — Z780 Asymptomatic menopausal state: Secondary | ICD-10-CM | POA: Diagnosis not present

## 2021-04-27 NOTE — Progress Notes (Signed)
    Karla Lee [facility] 1951/01/27 [id number]        70 y.o.  [id number]   RP: Counseling and management of Osteoporosis  HPI: BD 04/14/2021 Osteoporosis at the [facility] T-Score -2.5.  No significant change in BD at any site.  BMI 25.15.  Goes to the gym 3x/week.  Taking Vit D 2000 IU daily/Ca++ in food.  BD 01/2019  Osteopenia with T-Score -2.4 at the [facility].  Good balance.  No recent fall.  No h/o fracture.     OB History  Gravida Para Term Preterm AB Living  4 2     2 2   SAB IAB Ectopic Multiple Live Births  2            # Outcome Date GA Lbr Len/2nd Weight Sex Delivery Anes PTL Lv  4 SAB           3 SAB           2 Para           1 Para             Past medical history,surgical history, problem list, medications, allergies, family history and social history were all reviewed and documented in the EPIC chart.   Directed ROS with pertinent positives and negatives documented in the history of present illness/assessment and plan.  Exam:  Vitals:   04/27/21 1326  BP: 110/72   General appearance:  Normal  Bone Density 04/14/2021:   Assessment/Plan:  70 y.o. 04/16/2021   1. Age-related osteoporosis without current pathological fracture BD 04/14/2021 Osteoporosis at the [facility] T-Score -2.5.  No significant change in BD at any site.  BMI 25.15.  Goes to the gym 3x/week.  Taking Vit D 2000 IU daily/Ca++ in food.  BD 01/2019 Osteopenia with T-Score -2.4 at the [facility].  Good balance.  No recent fall.  No h/o fracture.  Counseling on the fact that although she has a Dx of Osteoporosis now, there was no significant change in her BD in the last 2 yrs.  At the lowest site, the [facility], she went from a T-Score of -2.4 to -2.5.  Decision to improve her weight bearing activities and optimize her total Ca++ intake to 1200 to 1500 mg daily.  Continue with Vit D.  Counseling on Bone Medication done, prefers not to start at this time.  Risk of fractures if falls discussed and patient  voiced understanding.  Will repeat a BD in 2 years.  2. Postmenopause  Well on no HRT.  02/2019 MD, 1:54 PM 04/27/2021

## 2021-04-29 ENCOUNTER — Ambulatory Visit: Payer: Medicare Other | Admitting: Obstetrics & Gynecology

## 2022-01-18 ENCOUNTER — Other Ambulatory Visit: Payer: Self-pay | Admitting: Family Medicine

## 2022-01-18 DIAGNOSIS — Z1231 Encounter for screening mammogram for malignant neoplasm of breast: Secondary | ICD-10-CM

## 2022-02-18 IMAGING — MG MM DIGITAL SCREENING BILAT W/ TOMO AND CAD
8 series · 9 of 24 positions shown · non-contrast
Comparison: Previous exam(s).

CLINICAL DATA: Screening.

EXAM:
DIGITAL SCREENING BILATERAL MAMMOGRAM WITH TOMOSYNTHESIS AND CAD
TECHNIQUE: Bilateral screening digital craniocaudal and mediolateral oblique
mammograms were obtained. Bilateral screening digital breast
tomosynthesis was performed. The images were evaluated with
computer-aided detection.

[L CC synth-2D]
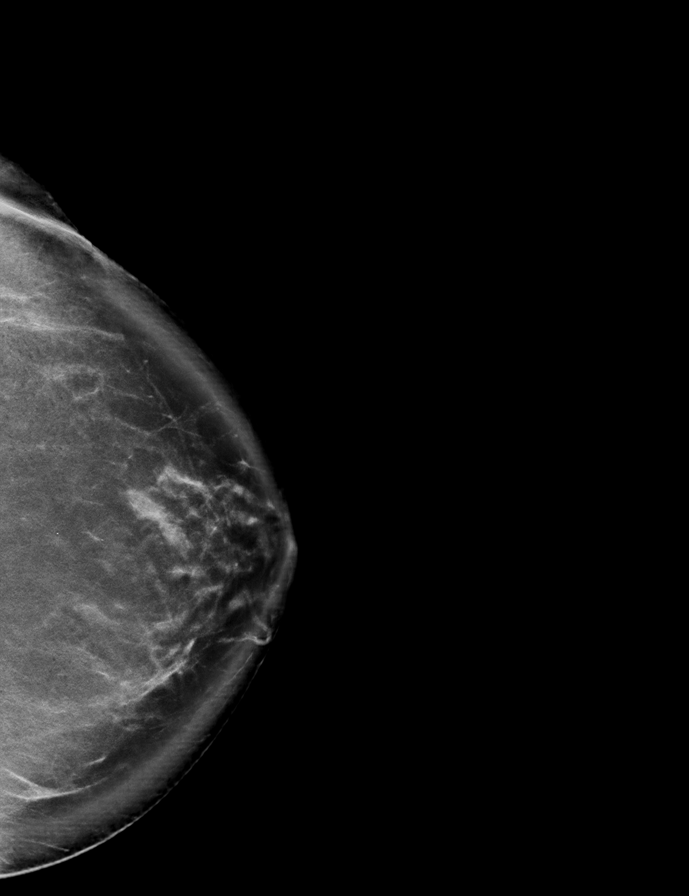

[R CC synth-2D]
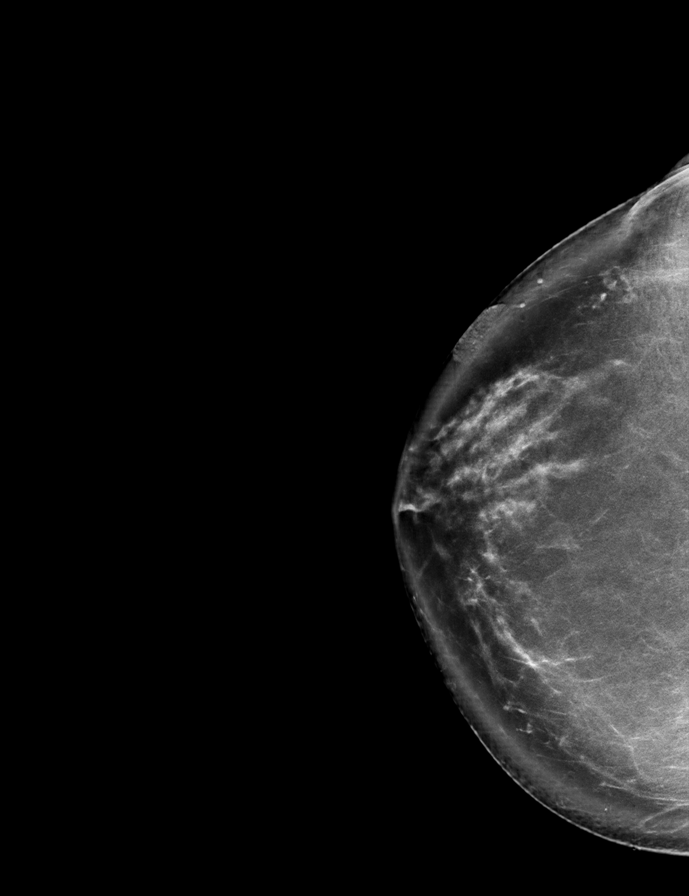

[L MLO synth-2D]
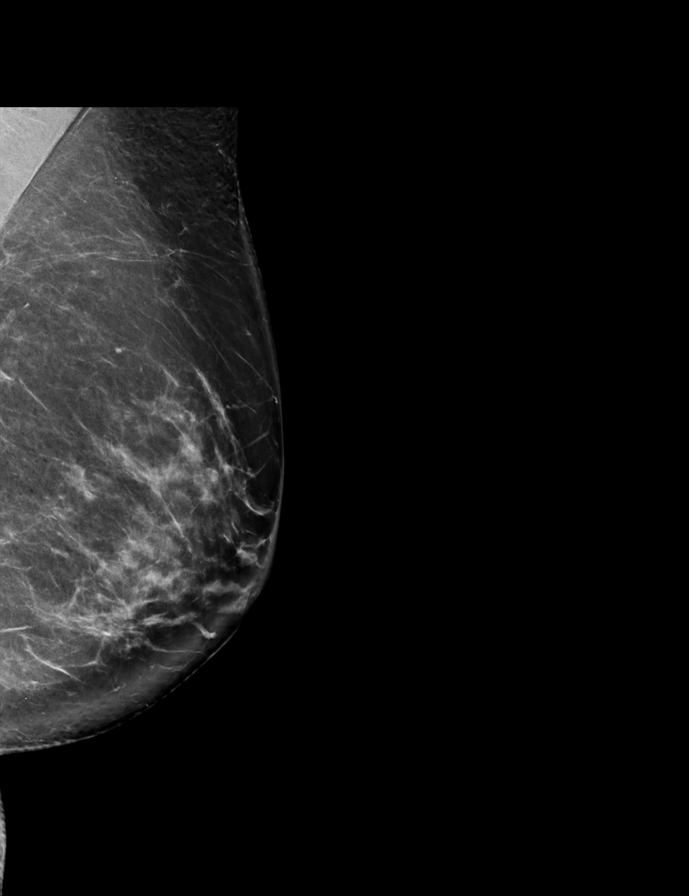

[R MLO synth-2D]
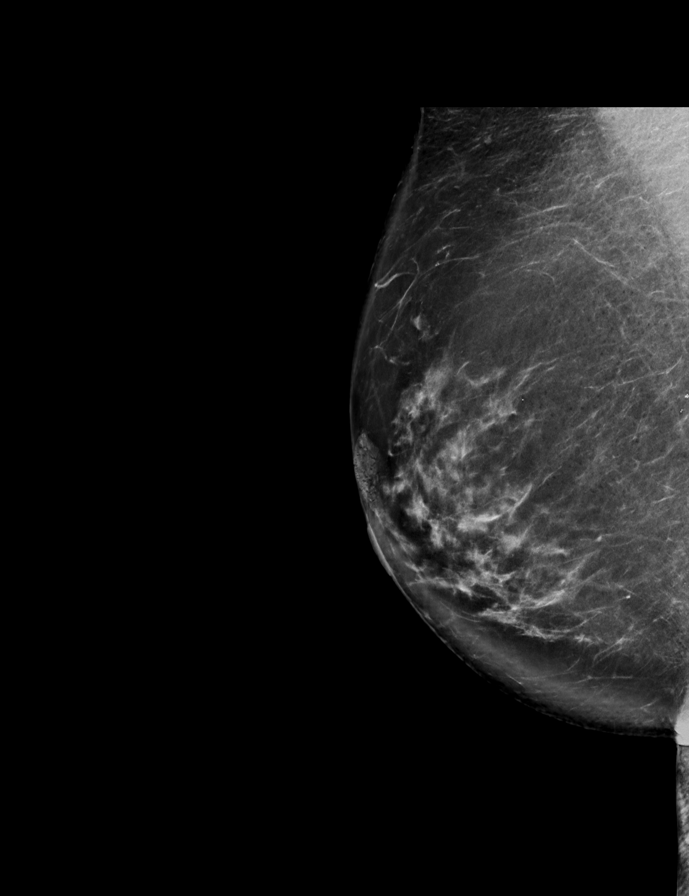

[R CC tomo · 2 of 101 frames shown]
[frame 33/101]
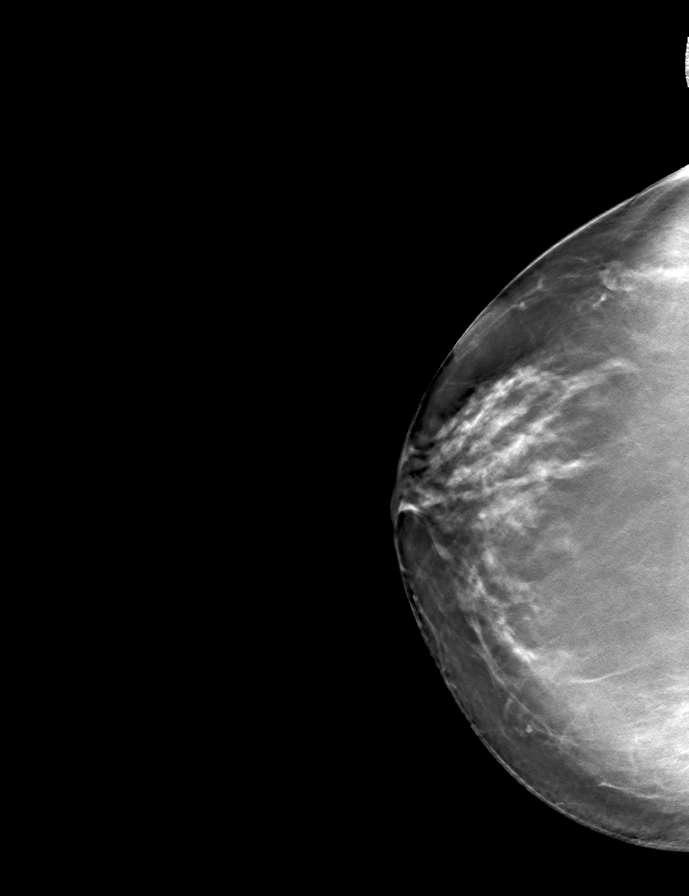
[frame 51/101]
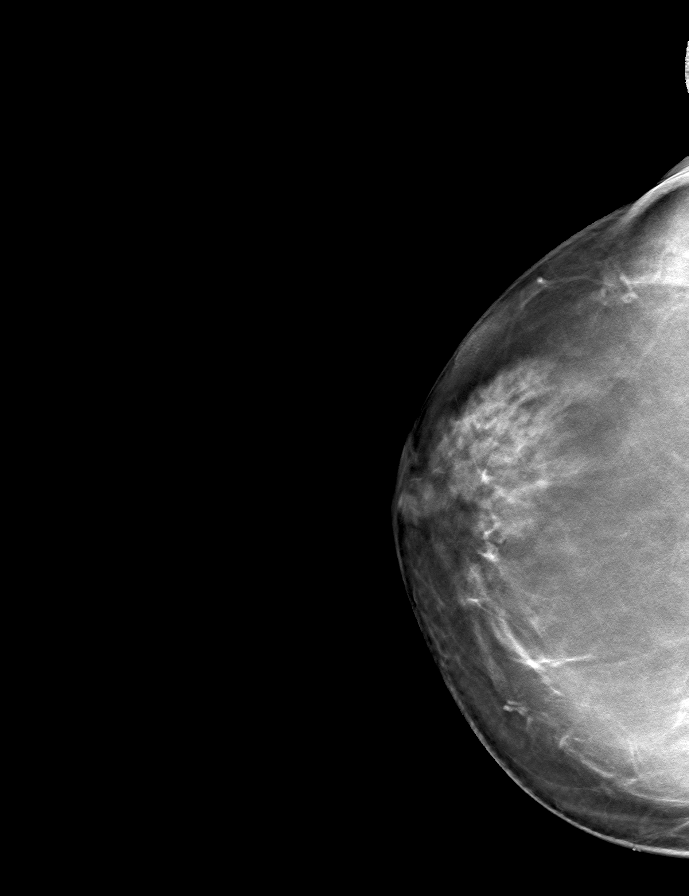

[L MLO tomo · tomo slice 45/88.0]
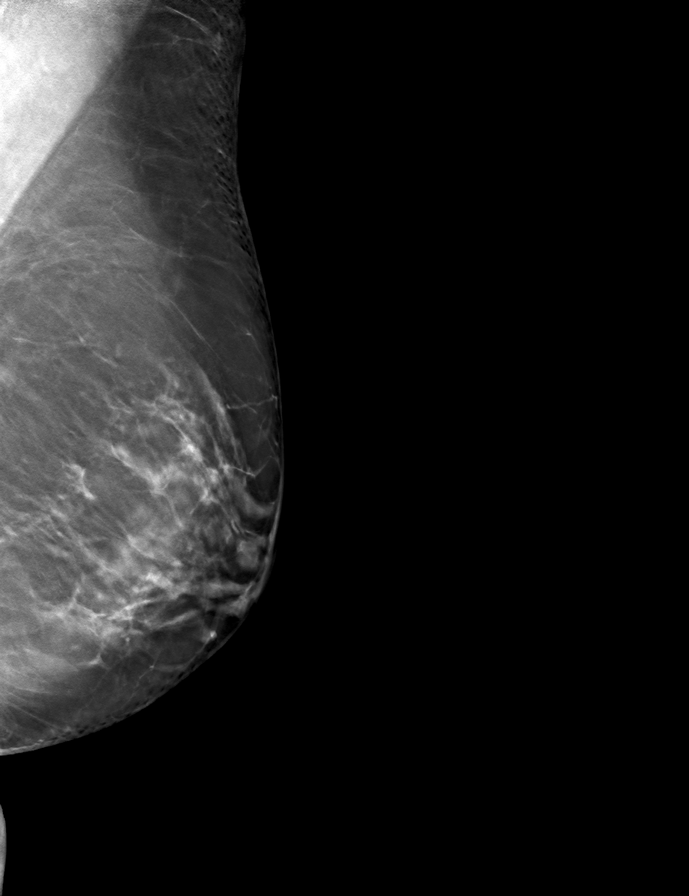

[L CC tomo · tomo slice 53/105.0]
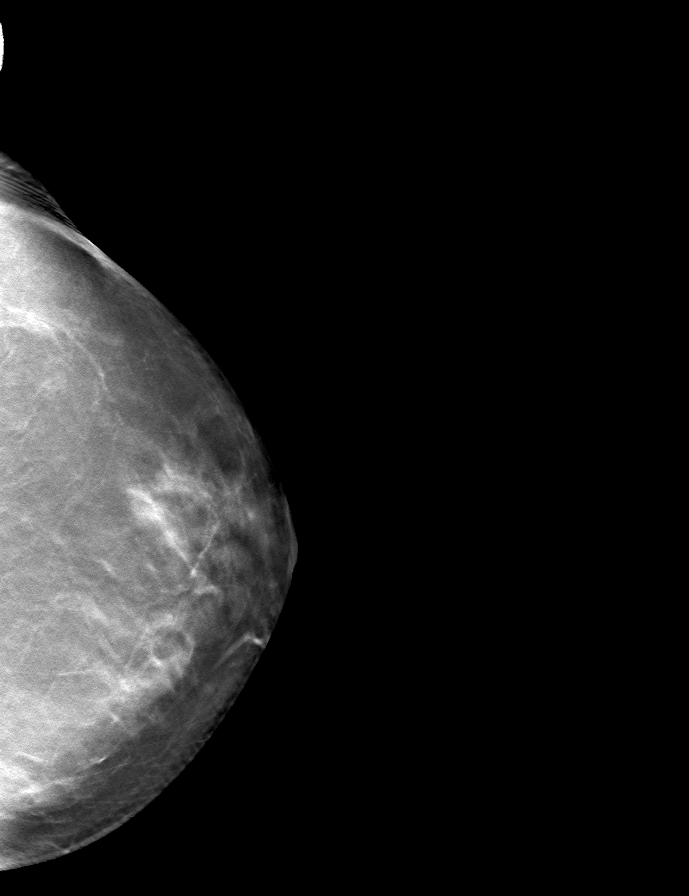

[R MLO tomo · tomo slice 49/96.0]
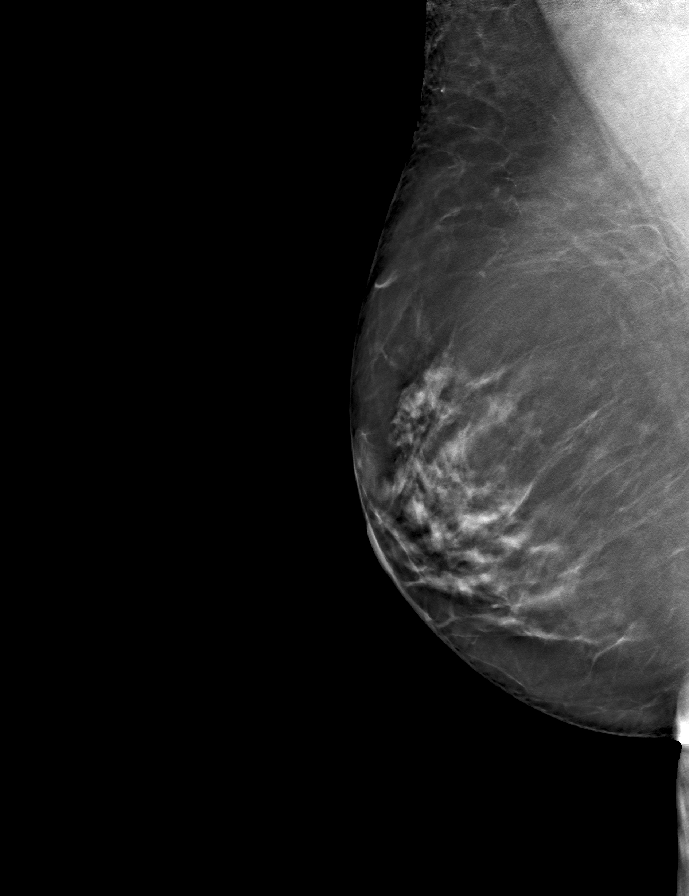

[9 of 24 positions shown; findings below may reference images not displayed]

ACR Breast Density Category b: There are scattered areas of
fibroglandular density.
FINDINGS: There are no findings suspicious for malignancy.
IMPRESSION: No mammographic evidence of malignancy. A result letter of this
screening mammogram will be mailed directly to the patient.

RECOMMENDATION:
Screening mammogram in one year. (Code:51-O-LD2)

BI-RADS CATEGORY  1: Negative.

## 2022-03-01 ENCOUNTER — Ambulatory Visit
Admission: RE | Admit: 2022-03-01 | Discharge: 2022-03-01 | Disposition: A | Discharge status: Home / Self Care | Payer: Medicare Other | Source: Ambulatory Visit | Attending: Family Medicine | Admitting: Family Medicine

## 2022-03-01 DIAGNOSIS — Z1231 Encounter for screening mammogram for malignant neoplasm of breast: Secondary | ICD-10-CM

## 2022-04-28 ENCOUNTER — Ambulatory Visit (INDEPENDENT_AMBULATORY_CARE_PROVIDER_SITE_OTHER): Payer: Medicare Other | Admitting: Obstetrics & Gynecology

## 2022-04-28 ENCOUNTER — Other Ambulatory Visit (HOSPITAL_COMMUNITY)
Admission: RE | Admit: 2022-04-28 | Discharge: 2022-04-28 | Disposition: A | Discharge status: Home / Self Care | Payer: Medicare Other | Source: Ambulatory Visit | Attending: Obstetrics & Gynecology | Admitting: Obstetrics & Gynecology

## 2022-04-28 ENCOUNTER — Encounter: Payer: Self-pay | Admitting: Obstetrics & Gynecology

## 2022-04-28 VITALS — BP 114/70 | HR 78 | Ht 64.75 in | Wt 148.0 lb

## 2022-04-28 DIAGNOSIS — Z9189 Other specified personal risk factors, not elsewhere classified: Secondary | ICD-10-CM | POA: Diagnosis not present

## 2022-04-28 DIAGNOSIS — Z01419 Encounter for gynecological examination (general) (routine) without abnormal findings: Secondary | ICD-10-CM

## 2022-04-28 DIAGNOSIS — Z90711 Acquired absence of uterus with remaining cervical stump: Secondary | ICD-10-CM | POA: Diagnosis not present

## 2022-04-28 DIAGNOSIS — Z9889 Other specified postprocedural states: Secondary | ICD-10-CM

## 2022-04-28 DIAGNOSIS — M810 Age-related osteoporosis without current pathological fracture: Secondary | ICD-10-CM | POA: Diagnosis not present

## 2022-04-28 DIAGNOSIS — Z9289 Personal history of other medical treatment: Secondary | ICD-10-CM

## 2022-04-28 DIAGNOSIS — B009 Herpesviral infection, unspecified: Secondary | ICD-10-CM

## 2022-04-28 NOTE — Progress Notes (Signed)
Karla Lee [facility] 1950/08/05 [id number]   History:    71 y.o. Karla Lee Married. Has a total of 9 grand-children.   RP:  Established patient presenting for annual gyn exam    HPI: S/P Supracervical Hysterectomy.  Postmenopause, well on no HRT.  No PMB. Remote h/o LEEP.  Pap neg 03/2021.  Pap reflex today. No pelvic pain.  No pain with IC.  Urine/BMs wnl.  BMI 24.82.  Goes to the gym 3x/week.  Taking Vit D/Ca++.  BD 03/2021 Osteoporosis AP Spine T-Score -2.5.  BD stable. Decision not to start on Bone Medication.  Breasts normal.  Screening mammo 02/2022 Neg.  Fasting labs with Fam MD.  Karla Lee 2021.  Pt declined flu vaccine today   Past medical history,surgical history, family history and social history were all reviewed and documented in the EPIC chart.  Gynecologic History No LMP recorded. Patient has had a hysterectomy.  Obstetric History Karla Lee History  Gravida Para Term Preterm AB Living  4 2 2   2 2   SAB IAB Ectopic Multiple Live Births  2            # Outcome Date GA Lbr Len/2nd Weight Sex Delivery Anes PTL Lv  4 SAB           3 SAB           2 Term           1 Term              ROS: A ROS was performed and pertinent positives and negatives are included in the history. GENERAL: No fevers or chills. HEENT: No change in vision, no earache, sore throat or sinus congestion. NECK: No pain or stiffness. CARDIOVASCULAR: No chest pain or pressure. No palpitations. PULMONARY: No shortness of breath, cough or wheeze. GASTROINTESTINAL: No abdominal pain, nausea, vomiting or diarrhea, melena or bright red blood per rectum. GENITOURINARY: No urinary frequency, urgency, hesitancy or dysuria. MUSCULOSKELETAL: No joint or muscle pain, no back pain, no recent trauma. DERMATOLOGIC: No rash, no itching, no lesions. ENDOCRINE: No polyuria, polydipsia, no heat or cold intolerance. No recent change in weight. HEMATOLOGICAL: No anemia or easy bruising or bleeding. NEUROLOGIC: No headache,  seizures, numbness, tingling or weakness. PSYCHIATRIC: No depression, no loss of interest in normal activity or change in sleep pattern.     Exam:   BP 114/70   Pulse 78   Ht 5' 4.75" (1.645 m)   Wt 148 lb (67.1 kg)   SpO2 99%   BMI 24.82 kg/m   Body mass index is 24.82 kg/m.  General appearance : Well developed well nourished female. No acute distress HEENT: Eyes: no retinal hemorrhage or exudates,  Neck supple, trachea midline, no carotid bruits, no thyroidmegaly Lungs: Clear to auscultation, no rhonchi or wheezes, or rib retractions  Heart: Regular rate and rhythm, no murmurs or gallops Breast:Examined in sitting and supine position were symmetrical in appearance, no palpable masses or tenderness,  no skin retraction, no nipple inversion, no nipple discharge, no skin discoloration, no axillary or supraclavicular lymphadenopathy Abdomen: no palpable masses or tenderness, no rebound or guarding Extremities: no edema or skin discoloration or tenderness  Pelvic: Vulva: Normal             Vagina: No gross lesions or discharge  Cervix/Uterus Absent  Adnexa  Without masses or tenderness  Anus: Normal   Assessment/Plan:  71 y.o. female for annual exam   1. Encounter for routine gynecological examination with  Papanicolaou smear of cervix S/P Supracervical Hysterectomy.  Postmenopause, well on no HRT.  No PMB. Remote h/o LEEP.  Pap neg 03/2021.  Pap reflex today. No pelvic pain.  No pain with IC.  Urine/BMs wnl.  BMI 24.82.  Goes to the gym 3x/week.  Taking Vit D/Ca++.  BD 03/2021 Osteoporosis AP Spine T-Score -2.5.  BD stable. Decision not to start on Bone Medication.  Breasts normal.  Screening mammo 02/2022 Neg.  Fasting labs with Fam MD.  Karla Lee 2021.  Pt declined flu vaccine today  - Cytology - PAP( [place])  2. H/O abdominal supracervical subtotal hysterectomy  3. H/O LEEP - Cytology - PAP( [place])  4. Age-related osteoporosis without current pathological  fracture Goes to the gym 3x/week.  Taking Vit D/Ca++.  BD 03/2021 Osteoporosis AP Spine T-Score -2.5.  BD stable. Decision not to start on Bone Medication. Repeat BD at 2 years. - DG Bone Density; Future  5. Personal history of other medical treatment  Other orders - triamcinolone cream (KENALOG) 0.5 %; Apply topically. - B Complex Vitamins (B COMPLEX PO); Take by mouth.   Genia Del MD, 11:22 AM

## 2022-05-01 ENCOUNTER — Encounter: Payer: Self-pay | Admitting: Obstetrics & Gynecology

## 2022-05-04 LAB — CYTOLOGY - PAP
Diagnosis: NEGATIVE
Diagnosis: REACTIVE

## 2022-06-14 ENCOUNTER — Other Ambulatory Visit: Payer: Self-pay | Admitting: *Deleted

## 2022-06-14 ENCOUNTER — Ambulatory Visit (INDEPENDENT_AMBULATORY_CARE_PROVIDER_SITE_OTHER): Payer: Medicare Other | Admitting: Radiology

## 2022-06-14 ENCOUNTER — Other Ambulatory Visit: Payer: Self-pay | Admitting: Radiology

## 2022-06-14 VITALS — BP 116/82

## 2022-06-14 DIAGNOSIS — N958 Other specified menopausal and perimenopausal disorders: Secondary | ICD-10-CM

## 2022-06-14 DIAGNOSIS — R319 Hematuria, unspecified: Secondary | ICD-10-CM

## 2022-06-14 DIAGNOSIS — R82998 Other abnormal findings in urine: Secondary | ICD-10-CM

## 2022-06-14 LAB — WET PREP FOR TRICH, YEAST, CLUE

## 2022-06-14 MED ORDER — ESTRADIOL 0.1 MG/GM VA CREA: 1.0000 g | TOPICAL_CREAM | VAGINAL | 3 refills | Status: DC

## 2022-06-14 NOTE — Progress Notes (Signed)
Wet prep order added

## 2022-06-14 NOTE — Progress Notes (Signed)
      Subjective: Karla Lee is a 72 y.o. female who complains of dark cloudy urine, no frequency, no urgency, no dysuria, vulvar discomfort, vulvar burning, no odor, no itching, no discharge. Symptoms x's 3 days.     Review of Systems  Past Medical History:  Diagnosis Date   Abnormal Pap smear of cervix    High cholesterol    HSV-1 infection    Osteopenia 01/2017   T score -2.3 FRAX 9.5%/1.4%      Objective:  Today's Vitals   06/14/22 1501  BP: 116/82   There is no height or weight on file to calculate BMI.   -General: no acute distress -Abdomen: SNT, no CVAT -Urethra: erythema -Vulva: without lesions or discharge -Vagina: discharge present, aptima swab and wet prep obtained -Perineum: no lesions   Microscopic wet-mount exam shows negative for pathogens, normal epithelial cells.  Urine dipstick shows positive for RBC's.  Micro exam: few+ bacteria.   Chaperone offered and declined.  Assessment:/Plan:   1. Dark urine - Urinalysis,Complete w/RFL Culture  2. Genitourinary syndrome of menopause - estradiol (ESTRACE VAGINAL) 0.1 MG/GM vaginal cream; Place 1 g vaginally 2 (two) times a week. At bedtime  Dispense: 42.5 g; Refill: 3   Discussed GSM in detail and the benefits of beginning vaginal estrogen. Will contact with results of urine culture and manage accordingly. Push fluids, discussed prevention of UTIs and vaginal infections.

## 2022-06-16 ENCOUNTER — Encounter: Payer: Self-pay | Admitting: *Deleted

## 2022-06-16 LAB — URINALYSIS, COMPLETE W/RFL CULTURE
Bacteria, UA: NONE SEEN /HPF
Bilirubin Urine: NEGATIVE
Glucose, UA: NEGATIVE
Hyaline Cast: NONE SEEN /LPF
Ketones, ur: NEGATIVE
Leukocyte Esterase: NEGATIVE
Nitrites, Initial: NEGATIVE
Protein, ur: NEGATIVE
Specific Gravity, Urine: 1.025 (ref 1.001–1.035)
WBC, UA: NONE SEEN /HPF (ref 0–5)
pH: 5.5 (ref 5.0–8.0)

## 2022-06-16 LAB — URINE CULTURE
MICRO NUMBER:: 14434778
Result:: NO GROWTH
SPECIMEN QUALITY:: ADEQUATE

## 2022-06-16 LAB — CULTURE INDICATED

## 2022-11-03 ENCOUNTER — Other Ambulatory Visit (INDEPENDENT_AMBULATORY_CARE_PROVIDER_SITE_OTHER): Payer: Medicare Other

## 2022-11-03 ENCOUNTER — Ambulatory Visit: Payer: Medicare Other | Admitting: Orthopaedic Surgery

## 2022-11-03 DIAGNOSIS — G8929 Other chronic pain: Secondary | ICD-10-CM

## 2022-11-03 DIAGNOSIS — M25562 Pain in left knee: Secondary | ICD-10-CM | POA: Diagnosis not present

## 2022-11-03 NOTE — Progress Notes (Signed)
Karla Lee Bible comes in today for follow-up as a relates to her left knee.  She is very active and young appearing 72 year old female.  We did obtain an MRI of her left knee back in 2019 and just showed some signal changes in the meniscus and only slight thinning of the cartilage.  There was a small Baker's cyst.  Examination of her left knee today shows no effusion.  There is a small cyst type of area at the lateral joint line and she does get pain in the back of her knee with the extremes of flexion.  The knee feels him with a stable and her Lachman's and McMurray's exam are negative.  She says she does not have issues on a daily basis but it does occur from time to time.  2 views of the left knee show still well-maintained joint space throughout the knee with neutral alignment and no complicating features or worrisome findings.  She said this does give her reassurance that she does not have severe arthritis in her knee.  If she continues to have symptoms in the back of her knee and some locking and catching, I would recommend at this point her arthroscopic intervention.  We did look at her previous MRI and there are some signal changes in the meniscus but no discrete tear both medial and lateral and I think this would be the next step forward would be arthroscopic intervention if things become problematic.  She understands this.  All question concerns were answered addressed.  Follow-up otherwise is as needed.

## 2023-01-10 NOTE — Telephone Encounter (Signed)
Left message for pt

## 2023-01-17 ENCOUNTER — Other Ambulatory Visit: Payer: Self-pay | Admitting: Family Medicine

## 2023-01-17 DIAGNOSIS — Z1231 Encounter for screening mammogram for malignant neoplasm of breast: Secondary | ICD-10-CM

## 2023-03-06 ENCOUNTER — Ambulatory Visit
Admission: RE | Admit: 2023-03-06 | Discharge: 2023-03-06 | Disposition: A | Discharge status: Home / Self Care | Payer: Medicare Other | Source: Ambulatory Visit | Attending: Family Medicine | Admitting: Family Medicine

## 2023-03-06 DIAGNOSIS — Z1231 Encounter for screening mammogram for malignant neoplasm of breast: Secondary | ICD-10-CM

## 2023-04-17 ENCOUNTER — Ambulatory Visit: Payer: Medicare Other | Admitting: Physician Assistant

## 2023-04-17 ENCOUNTER — Encounter: Payer: Self-pay | Admitting: Physician Assistant

## 2023-04-17 DIAGNOSIS — M25462 Effusion, left knee: Secondary | ICD-10-CM | POA: Diagnosis not present

## 2023-04-17 NOTE — Progress Notes (Signed)
HPI: Karla Lee returns today due to ongoing left knee pain.  She continues to have fluid on her knee and pain.  She states she is limping.  She states she has had swelling in the knee for months now.  She feels a catching sensation in the knee.  Has tenderness along the lateral joint line.  Prior MRI of her knee did show signal changes in the meniscus and only slight thinning of the cartilage.  Radiographs of her knee overall show well-maintained joint space with only minimal arthritic changes.  Review of systems: See HPI otherwise negative or noncontributory  Physical exam: Bilateral knees no abnormal warmth erythema.  Left knee effusion right knee no effusion.  Tenderness along the lateral joint line.  No instability valgus varus stressing.  Pain with extremes of flexion but overall good range of motion left knee.  Impression: Left knee effusion   Plan: Given the fact that she continues to have mechanical symptoms, and effusion and pain in her knee despite no significant arthritic changes recommend left knee arthroscopy.  Risk benefits of surgery discussed with patient at length.  We will work on scheduling for her left knee arthroscopy with debridement.  Follow-up 1 week postop.

## 2023-04-25 ENCOUNTER — Telehealth: Payer: Self-pay

## 2023-04-25 NOTE — Telephone Encounter (Signed)
I called patient to discuss scheduling knee scope.  Left voice mail for return call.

## 2023-04-26 ENCOUNTER — Telehealth: Payer: Self-pay

## 2023-04-26 NOTE — Telephone Encounter (Signed)
Patient called back and stated needs to delay surgery.  Will call me back to schedule hopefully in February 2025.

## 2023-05-01 ENCOUNTER — Ambulatory Visit (INDEPENDENT_AMBULATORY_CARE_PROVIDER_SITE_OTHER): Payer: Medicare Other | Admitting: Obstetrics and Gynecology

## 2023-05-01 ENCOUNTER — Encounter: Payer: Self-pay | Admitting: Obstetrics and Gynecology

## 2023-05-01 VITALS — BP 128/86 | HR 69 | Ht 64.75 in | Wt 149.0 lb

## 2023-05-01 DIAGNOSIS — N952 Postmenopausal atrophic vaginitis: Secondary | ICD-10-CM | POA: Diagnosis not present

## 2023-05-01 DIAGNOSIS — B009 Herpesviral infection, unspecified: Secondary | ICD-10-CM

## 2023-05-01 DIAGNOSIS — M8589 Other specified disorders of bone density and structure, multiple sites: Secondary | ICD-10-CM

## 2023-05-01 DIAGNOSIS — Z9189 Other specified personal risk factors, not elsewhere classified: Secondary | ICD-10-CM | POA: Diagnosis not present

## 2023-05-01 DIAGNOSIS — Z01419 Encounter for gynecological examination (general) (routine) without abnormal findings: Secondary | ICD-10-CM

## 2023-05-01 NOTE — Progress Notes (Signed)
72 y.o. y.o. female here for annual exam. No LMP recorded. Patient has had a hysterectomy.    History:    72 y.o. G62P2A2L2 Married. Has a total of 9 grand-children.   RP:  Established patient presenting for annual gyn exam    HPI: S/P Supracervical Hysterectomy.  Postmenopause, well on no HRT.  No PMB. Remote h/o LEEP.  Pap neg 03/2021.  Pap reflex today. No pelvic pain.  No pain with IC.  Urine/BMs wnl.  BMI 24.82.  Goes to the gym 3x/week.  Taking Vit D/Ca++.  BD 03/2021 Osteoporosis AP Spine T-Score -2.5 2024 repeat at [facility] still with osteopenia.  BD stable. Decision not to start on Bone Medication.  Breasts normal.  Screening mammo 02/2023 Neg.  Fasting labs with Fam MD.  [name] 2021.  Pt declined flu vaccine today  Body mass index is 24.99 kg/m.      No data to display          Blood pressure 128/86, pulse 69, height 5' 4.75" (1.645 m), weight 149 lb (67.6 kg), SpO2 99%.     Component Value Date/Time   DIAGPAP  04/28/2022 1135    - Negative for intraepithelial lesion or malignancy (NILM)   DIAGPAP - Benign reactive/reparative changes 04/28/2022 1135   DIAGPAP  04/08/2021 1141    - Negative for intraepithelial lesion or malignancy (NILM)   ADEQPAP  04/28/2022 1135    Satisfactory for evaluation. The presence or absence of an   ADEQPAP  04/28/2022 1135    endocervical/transformation zone component cannot be determined because   ADEQPAP of atrophy. 04/28/2022 1135    GYN HISTORY:    Component Value Date/Time   DIAGPAP  04/28/2022 1135    - Negative for intraepithelial lesion or malignancy (NILM)   DIAGPAP - Benign reactive/reparative changes 04/28/2022 1135   DIAGPAP  04/08/2021 1141    - Negative for intraepithelial lesion or malignancy (NILM)   ADEQPAP  04/28/2022 1135    Satisfactory for evaluation. The presence or absence of an   ADEQPAP  04/28/2022 1135    endocervical/transformation zone component cannot be determined because   ADEQPAP of atrophy.  04/28/2022 1135    OB History  Gravida Para Term Preterm AB Living  4 2 2   2 2   SAB IAB Ectopic Multiple Live Births  2            # Outcome Date GA Lbr Len/2nd Weight Sex Type Anes PTL Lv  4 SAB           3 SAB           2 Term           1 Term             Past Medical History:  Diagnosis Date   Abnormal Pap smear of cervix    High cholesterol    HSV-1 infection    Osteopenia 01/2017   T score -2.3 FRAX 9.5%/1.4%    Past Surgical History:  Procedure Laterality Date   ABDOMINAL HYSTERECTOMY  1999   SUPRACERVICAL HYSTERECTOMY    CERVICAL BIOPSY  W/ LOOP ELECTRODE EXCISION     2003   SHOULDER SURGERY  04/2014   arhroscopic    Current Outpatient Medications on File Prior to Visit  Medication Sig Dispense Refill   B Complex Vitamins (B COMPLEX PO) Take by mouth.     cholecalciferol (VITAMIN D) 1000 UNITS tablet Take 1,000 Units by mouth daily.  Coenzyme Q10 (CO Q-10) 100 MG CAPS Take by mouth.     Cranberry 500 MG CAPS Take by mouth.     estradiol (ESTRACE VAGINAL) 0.1 MG/GM vaginal cream Place 1 g vaginally 2 (two) times a week. At bedtime 42.5 g 3   MAGNESIUM PO Take 250 mg by mouth.     OVER THE COUNTER MEDICATION TUMERIC     Quercetin 250 MG TABS Take 500 mg by mouth.     simvastatin (ZOCOR) 10 MG tablet Take 1 tablet by mouth at bedtime 90 tablet 1   No current facility-administered medications on file prior to visit.    Social History   Socioeconomic History   Marital status: Married    Spouse name: Not on file   Number of children: Not on file   Years of education: Not on file   Highest education level: Not on file  Occupational History   Not on file  Tobacco Use   Smoking status: Never   Smokeless tobacco: Never  Vaping Use   Vaping status: Never Used  Substance and Sexual Activity   Alcohol use: Yes    Comment: WINE occ   Drug use: No   Sexual activity: Yes    Birth control/protection: Surgical    Comment: hysterectomy, older than 16,  less than 5  Other Topics Concern   Not on file  Social History Narrative   Not on file   Social Determinants of Health   Financial Resource Strain: Low Risk  (04/03/2023)   Received from [facility]   Overall Financial Resource Strain (CARDIA)    Difficulty of Paying Living Expenses: Not hard at all  Food Insecurity: No Food Insecurity (04/03/2023)   Received from [facility]   Hunger Vital Sign    Worried About Running Out of Food in the Last Year: Never true    Ran Out of Food in the Last Year: Never true  Transportation Needs: No Transportation Needs (04/03/2023)   Received from [facility] - Transportation    Lack of Transportation (Medical): No    Lack of Transportation (Non-Medical): No  Physical Activity: Sufficiently Active (04/03/2023)   Received from [facility]   Exercise Vital Sign    Days of Exercise per Week: 5 days    Minutes of Exercise per Session: 70 min  Stress: No Stress Concern Present (04/03/2023)   Received from [facility] - Occupational Stress Questionnaire    Feeling of Stress : Only a little  Social Connections: Socially Integrated (04/03/2023)   Received from [facility]   Social Network    How would you rate your social network (family, work, friends)?: Good participation with social networks  Intimate Partner Violence: Not At Risk (04/03/2023)   Received from [facility]   HITS    Over the last 12 months how often did your partner physically hurt you?: Never    Over the last 12 months how often did your partner insult you or talk down to you?: Never    Over the last 12 months how often did your partner threaten you with physical harm?: Never    Over the last 12 months how often did your partner scream or curse at you?: Never    Family History  Problem Relation Age of Onset   Heart disease Father      Allergies  Allergen Reactions   Aspirin Anaphylaxis   Nsaids Anaphylaxis    Other  Anaphylaxis    SHRIMP SHRIMP   Penicillins Anaphylaxis and Rash   Tolmetin Anaphylaxis   Sulfa Antibiotics Rash      Patient's last menstrual period was No LMP recorded. Patient has had a hysterectomy..             Review of Systems Alls systems reviewed and are negative.     Physical Exam Constitutional:      Appearance: Normal appearance.  Genitourinary:     Vulva normal.     No lesions in the vagina.     Right Labia: No rash, lesions or skin changes.    Left Labia: No lesions, skin changes or rash.    Vaginal cuff intact.    No vaginal discharge or tenderness.     No vaginal prolapse present.    No vaginal atrophy present.     Right Adnexa: not tender and no mass present.    Left Adnexa: not tender and no mass present.    Uterus is not absent. Breasts:    Right: Normal.     Left: Normal.  HENT:     Head: Normocephalic.  Neck:     Thyroid: No thyroid mass, thyromegaly or thyroid tenderness.  Cardiovascular:     Rate and Rhythm: Normal rate and regular rhythm.     Heart sounds: Normal heart sounds, S1 normal and S2 normal.  Pulmonary:     Effort: Pulmonary effort is normal.     Breath sounds: Normal breath sounds and air entry.  Abdominal:     General: There is no distension.     Palpations: Abdomen is soft. There is no mass.     Tenderness: There is no abdominal tenderness. There is no guarding or rebound.  Musculoskeletal:        General: Normal range of motion.     Cervical back: Full passive range of motion without pain, normal range of motion and neck supple. No tenderness.     Right lower leg: No edema.     Left lower leg: No edema.  Neurological:     Mental Status: She is alert.  Skin:    General: Skin is warm.  Psychiatric:        Mood and Affect: Mood normal.        Behavior: Behavior normal.        Thought Content: Thought content normal.  Vitals and nursing note reviewed. Exam conducted with a chaperone present.       A:          Well Woman GYN exam                             P:        Pap smear not indicated Encouraged annual mammogram screening Colon cancer screening up-to-date DXA up-to-date Labs and immunizations to do with PMD Discussed breast self exams Encouraged healthy lifestyle practices Encouraged Vit D and Calcium   No follow-ups on file.  Earley Favor

## 2023-11-13 ENCOUNTER — Other Ambulatory Visit: Payer: Self-pay | Admitting: Radiology

## 2023-11-13 DIAGNOSIS — N958 Other specified menopausal and perimenopausal disorders: Secondary | ICD-10-CM

## 2023-11-14 NOTE — Telephone Encounter (Signed)
 Med refill request: estradiol  0.1 mg vaginal cream Last AEX: 05/01/23  Next AEX: none scheduled Last MMG (if hormonal med) 03/06/23 BI-RADS 1 negative Refill authorized: estradiol  0.1 mg vaginal cream Please approve or deny as appropriate.

## 2024-02-13 ENCOUNTER — Other Ambulatory Visit: Payer: Self-pay | Admitting: Family Medicine

## 2024-02-13 DIAGNOSIS — Z1231 Encounter for screening mammogram for malignant neoplasm of breast: Secondary | ICD-10-CM

## 2024-03-07 ENCOUNTER — Ambulatory Visit
Admission: RE | Admit: 2024-03-07 | Discharge: 2024-03-07 | Disposition: A | Discharge status: Home / Self Care | Source: Ambulatory Visit | Attending: Family Medicine | Admitting: Family Medicine

## 2024-03-07 DIAGNOSIS — Z1231 Encounter for screening mammogram for malignant neoplasm of breast: Secondary | ICD-10-CM

## 2024-04-01 ENCOUNTER — Encounter: Payer: Self-pay | Admitting: Radiology
